# Patient Record
Sex: Female | Born: 1950 | Race: White | Hispanic: No | Smoking: Former smoker
Health system: Southern US, Community
[De-identification: ages and names within clinical notes are randomized; demographics above are authoritative.]

## PROBLEM LIST (undated history)

## (undated) DIAGNOSIS — I10 Essential (primary) hypertension: Secondary | ICD-10-CM

## (undated) DIAGNOSIS — Z8719 Personal history of other diseases of the digestive system: Secondary | ICD-10-CM

## (undated) DIAGNOSIS — Z973 Presence of spectacles and contact lenses: Secondary | ICD-10-CM

## (undated) DIAGNOSIS — H8110 Benign paroxysmal vertigo, unspecified ear: Secondary | ICD-10-CM

## (undated) DIAGNOSIS — Z87898 Personal history of other specified conditions: Secondary | ICD-10-CM

## (undated) DIAGNOSIS — E785 Hyperlipidemia, unspecified: Secondary | ICD-10-CM

## (undated) DIAGNOSIS — M1990 Unspecified osteoarthritis, unspecified site: Secondary | ICD-10-CM

## (undated) HISTORY — PX: TOTAL HIP ARTHROPLASTY: SHX124

## (undated) HISTORY — PX: BREAST SURGERY: SHX581

---

## 1997-10-23 ENCOUNTER — Other Ambulatory Visit: Admission: RE | Admit: 1997-10-23 | Discharge: 1997-10-23 | Payer: Self-pay | Admitting: Obstetrics and Gynecology

## 1998-12-08 ENCOUNTER — Other Ambulatory Visit: Admission: RE | Admit: 1998-12-08 | Discharge: 1998-12-08 | Payer: Self-pay | Admitting: Obstetrics and Gynecology

## 2000-01-31 ENCOUNTER — Other Ambulatory Visit: Admission: RE | Admit: 2000-01-31 | Discharge: 2000-01-31 | Payer: Self-pay | Admitting: Gynecology

## 2000-09-06 ENCOUNTER — Encounter: Payer: Self-pay | Admitting: Orthopedic Surgery

## 2000-09-11 ENCOUNTER — Inpatient Hospital Stay (HOSPITAL_COMMUNITY): Admission: RE | Admit: 2000-09-11 | Discharge: 2000-09-15 | Payer: Self-pay | Admitting: Orthopedic Surgery

## 2000-09-11 ENCOUNTER — Encounter: Payer: Self-pay | Admitting: Orthopedic Surgery

## 2001-03-27 ENCOUNTER — Other Ambulatory Visit: Admission: RE | Admit: 2001-03-27 | Discharge: 2001-03-27 | Payer: Self-pay | Admitting: Gynecology

## 2009-03-02 ENCOUNTER — Encounter: Admission: RE | Admit: 2009-03-02 | Discharge: 2009-03-02 | Payer: Self-pay | Admitting: Orthopedic Surgery

## 2010-07-16 NOTE — Discharge Summary (Signed)
[place]. [facility]  Patient:    Mindy Snyder, Mindy Snyder                   MRN: [id number] Adm. Date:  [id number] Disc. Date: [id number] Attending:  Carolan Shiver Ii Dictator:   Arnoldo Morale, P.A.C. CC:         Chales Salmon. Abigail Miyamoto, M.D., [facility]   Discharge Summary  ADMISSION DIAGNOSIS:  End-stage osteoarthritis right hip.  DISCHARGE DIAGNOSES: 1. End-stage osteoarthritis right hip. 2. Post hemorrhagic anemia. 3. Hypokalemia. 4. Postoperative constipation. 5. Hematoma right thigh.  SURGICAL PROCEDURE:  On September 11, 2000, Mindy Snyder underwent a right total knee arthroplasty by Jonny Ruiz L. Dorothyann Gibbs, M.D., assisted by Arnoldo Morale, P.A.C.  COMPLICATIONS:  None.  CONSULTS: 1. Physical therapy consult on September 12, 2000. 2. Case management and occupational therapy consult September 13, 2000. 3. Rehab medicine consult September 12, 2000.  HISTORY OF PRESENT ILLNESS:  A 60 year old white female patient presented to Dr. Priscille Kluver with a two to three-year history of gradual onset of progressively worsening right hip pain. There has been no known injury to her hip but the pain has been getting progressively worse since March of 2002.  The pain is a burning and throbbing sensation in the right buttock and groin area that is intermittent and occasionally radiates to the lateral aspect of her right leg. It increases with any walking or standing and decreases with sitting and lying down.  It keeps her awake at night and she has required the use of a cane p.r.n.  She has failed conservative therapy and because of that, she is presenting for a right total hip arthroplasty.  HOSPITAL COURSE:  The patient tolerated her surgical procedure well without immediate postoperative complications. She was transferred to 5000.  Postop day #1, T-max was 98.9, vitals were stable, pain controlled.  Hemoglobin was 9, hematocrit 25.9.  Right hip dressing intact with minimal  drainage.  Leg neurovascularly intact. She was started on iron and hemoglobin was monitored.  On postop day #2, no complaints of lightheadedness when out of bed.  T-max was 100.6, pulse 97, blood pressure 100/60.  Right hip incision well approximated with staples without drainage. Hemoglobin 86, hematocrit 24.8.  Potassium was low at 3.3.  She was continued on iron, given potassium supplements and was watched for symptoms of the anemia. She did become symptomatic that day and was subsequently transfused with two units of packed red blood cells.  She was switched to p.o. pain medications and an Ace wrap was applied around her thigh.  On postop day #3, she was feeling much better.  Pain remained controlled.  The T-max was 99.3, vitals stable.  Right leg unchanged.  Edema of the right thigh did appear to be improving. Hemoglobin was 10, hematocrit 28.4.  Hypokalemia had resolved with potassium now of 3.8.  Laxatives were given for her complaints of constipation and she was continued on therapy.  On September 15, 2000, she was doing well enough for discharge home.  T-max was 100.4.  Right hip incision unchanged.  Hemoglobin at that time to follow up after the transfusion was 10.6 and 31. She was believed ready for discharge home.  DISCHARGE INSTRUCTIONS: 1. She is to resume all prehospitalization medications and diet except    for her ibuprofen and Vicodin. These medications include:    a. Premarin 1.25 mg p.o. q.d.    b. Progesterone 10 mg p.o. q.d.  c. Glucosamine and chondroitin sulfate one tablet p.o. q.d.  ADDITIONAL MEDICATIONS: 1. Arixtra 2.5 mg subcu q.d. at 5:30 p.m. for three more days. 2. Ferrous sulfate 325 mg p.o. b.i.d. 3. OxyContin 20 mg one p.o. q.12h. 22 with no refill. 4. OxyIR 5 mg one to two p.o. q.4h. p.r.n. for pain, 45 with no refill. 5. Keflex 500 mg p.o. q.i.d. for seven days, 28 with no refill.  ACTIVITY:  She is to be weightbearing as tolerated on the right  leg with use of the walker.  She is arranged for home health physical therapy per [facility]. She is to keep a K-pad to her right hip when she is resting.  WOUND CARE:  Keep the right hip incision clean and dry and change the dressing p.r.n. saturation.  Please notify Dr. Priscille Kluver if temperature greater than 101.5, chills, pain unrelieved by pain medications or foul smelling drainage from the wound.  FOLLOW-UP:  She needs to follow up with Dr. Priscille Kluver in our office on September 26, 2000, and she needs to call [phone] to set up that appointment.  She stated good understanding of these instructions and was discharged home.  LABORATORY DATA:  X-ray taken of her right hip on September 11, 2000, showed femoral head well located in the prosthetic acetabulum and no evidence of fracture.  On September 12, 2000, hemoglobin 9, hematocrit 25.9.  On September 13, 2000, hemoglobin 8.6, hematocrit 24.8.  On September 14, 2000, hemoglobin 10, hematocrit 28.4.  On September 15, 2000, hemoglobin 10.6, hematocrit 31.  On September 06, 2000, albumin 3.4.  On September 12, 2000, glucose 129, BUN 5, creatinine 0.6, calcium 8.3.  On September 13, 2000, sodium 138, potassium 3.3, glucose 118, BUN 3, creatinine 0.5 and calcium 8.2.  On September 14, 2000, sodium 141, potassium 3.8, chloride 105, CO2 30, glucose 107, BUN less than 3, creatinine 0.6, and calcium 8.7.  All other laboratory studies were within normal limits.    DD:  09/28/00 TD:  09/28/00 Job: [id number]

## 2010-07-16 NOTE — Op Note (Signed)
[place]. [facility]  Patient:    Mindy Snyder, HAND                   MRN: [id number] Proc. Date: 09/11/00 Adm. Date:  [id number] Attending:  Carolan Shiver Ii                           Operative Report  PREOPERATIVE DIAGNOSIS:  Osteoarthritis of the right hip.  SURGICAL PROCEDURE:  Right AML total hip replacement.  POSTOPERATIVE DIAGNOSIS:  Osteoarthritis of the right hip.  SURGEON:  John L. Dorothyann Gibbs, M.D.  ASSISTANT:  Arnoldo Morale, P.A.  ANESTHESIA:  General.  PATHOLOGY:  The patient is only 84, but she has worn out her hip with bone against bone and normal osteoarthritic appearance to bone femoral head and acetabulum.  DESCRIPTION OF PROCEDURE:  Under general anesthesia, the patient was placed in the left lateral decubitus position.  The hip was prepared with DuraPrep and draped as a sterile field.  A posterior approach was made, splitting the IT band in the line of its fibers and inserting a Charnley retractor.  The short external rotators and hip capsule were taken down from bone with electrocautery.  Multiple small vessels were cauterized.  The hip capsule was exposed and opened in a T-shaped manner.  The hip was then dislocated after hemostasis was complete.  The superior femoral neck was then exposed.  The IM initiator and canal finder were used, followed by progressive reaming to a #11.5 reamer, which gave an excellent tight fit.  The femoral neck was osteotomized and the first rasp a #10/5 narrow is used, followed by a #12 narrow.  Calcar reamers were used on both and, in seating the rasps, they fit fairly low, giving a fairly short neck length.  The calcar reamer was used to give an excellent tight fit inferiorly.  Following this, the acetabulum was exposed with two Cobras inferiorly and two wings superiorly.  The capsule was preserved, but the labrum was excised.  It was noted to be elliptical in shape with deeper and more round  superiorly.  It required reaming more than the initial templates, progressively reaming it up to a 52 mm.  Subsequently, a 53 mm gave an excellent rim fit.  It was sized for a 54 on rim fit trial.  A 54 300 series cup was then press fit in place.  It was an excellent tight fit. The external guide was used to assist in positioning of the acetabulum. Following this, trial reduction of a standard +10 poly and the 12 mm modified medial rasp and a +5 hip ball was used.  There was noted to be slight instability in flexion and internal rotation.  We tried several modifications including the anteverted neck and the +4 poly to try to make the hip more stable.  THe +4 poly made a huge difference.  It was then stable with a standard AML neck, +5 hip ball and the +4 poly.  Permanent components were then obtained.  The apex hole eliminator was used.  The wound was copiously irrigated with antibiotic solution.  The +4 acetabulum with the 10 degree lip was then inserted in place. The modified medial 12 mm AML stem, 5/8 Porocoat was inserted.  The 28 mm +5 neck length hip was used.  With permanent components in place, the hip was stable through normal range of motion.  The hip capsule was then  repaired with #1 Surgidek.  The piriformis and short external rotators were reattached.  The IT band was then closed.  The subcu was deeper than anticipated and closed in two layers with 0 and 2-0 Vicryl and the skin with clips.  OPERATIVE TIME:  1 hr 10 min.  ESTIMATED BLOOD LOSS:  200 cc.  DISPOSITION:  The patient tolerated the procedure well and returned to recovery in good condition. DD:  09/11/00 TD:  09/11/00 Job: [id number]

## 2010-07-16 NOTE — H&P (Signed)
[place]. [facility]  Patient:    Mindy Snyder, Mindy Snyder                   MRN: [id number] Adm. Date:  09/11/00 Attending:  Jonny Ruiz L. Dorothyann Gibbs, M.D. Dictator:   Arnoldo Morale, P.A.                         History and Physical  DATE OF BIRTH: [birth year]  CHIEF COMPLAINT: Progressively worsening right hip pain for the last two to three years.  HISTORY OF PRESENT ILLNESS: This 60 year old white female patient presented to Dr. Priscille Kluver with a two to three year history of gradual onset, but progressively worsening right hip pain.  She has no known injury or prior surgery to her hip.  The pain has been getting progressively worse especially since March 2002.  At this point the pain is described as a burning and throbbing sensation located in the right buttock and groin area that is intermittent.  The pain does occasionally radiate down the lateral aspect of her leg, but otherwise it increases with any walking or standing, and decreases with sitting and lying down.  The pain does keep her up at night. She does have to use a cane on occasion.  She has a significant limp when she walks.  She is currently taking ibuprofen and Vicodin p.r.n. for the pain, and that provides a moderate amount of relief.  She denies any popping, catching, grinding, locking, swelling, or paresthesias associated with the pain.  ALLERGIES: CODEINE causes GI upset.  CURRENT MEDICATIONS:  1. Premarin 1.25 mg one tablet p.o. q.d.  2. Progesterone 10 mg one tablet p.o. q.d.  (She has stopped taking that as     of September 04, 2000).  3. Ibuprofen 800 mg one tablet p.o. b.i.d. p.r.n. pain.  4. Vicodin one to two tablets p.o. q.4h p.r.n. for pain.  5. Glucosamine and chondroitin sulfate triple-strength tablets, one     tablet p.o. q.d.  PAST MEDICAL HISTORY: She denies any history of diabetes mellitus, hypertension thyroid disease, hiatal hernia, peptic ulcer disease, heart disease, reflux, or  any chronic medical condition other than noted previously.  PAST SURGICAL HISTORY:  1. Tonsillectomy at age 51.  2. Excision of benign breast lump, left breast, approximately 1970.  3. D&C in 1997.  SOCIAL HISTORY: She has a 20-30 pack-year history of cigarette smoking, which she quit in 1990.  She drinks one alcoholic beverage a night, and has done so probably for the last 30 years.  She does not use any drugs.  She is married and has two children.  She and her husband live in a one-story house, with four steps into the main entrance.  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  Her medical doctor is Dr. Abigail Miyamoto at [facility], and his phone number is [phone].  FAMILY HISTORY: Her mother is alive at age 28, with no major medical problems. Her father is alive at age 50, with diabetes, high cholesterol, and hypertension.  She has two brothers, age 36 and 77, and the 39 year old has diverticulosis.  Her daughters are age 82 and 21, and they are alive and healthy.  REVIEW OF SYSTEMS: Her last normal period was August 09, 2000.  She was advised to stop taking the progesterone to avoid a period while she is hospitalized. She is gravida 2 para 2, 0-0-2.  She does wear  glasses.  She does not have a Living Will nor a power of attorney.  All other systems are negative and noncontributory at this time.  PHYSICAL EXAMINATION:  GENERAL: This is a well-developed, well-nourished white female, who walks with a significant hip limp on the right.  There is an antalgic gait.  Mood and affect are appropriate.  She talks easily with the examiner.  VITAL SIGNS: Height 5 feet 5 inches.  Weight 135 pounds.  BMI 22.  TEMP 97.6 degrees Fahrenheit, pulse 88, respirations 18, and BP 116/60.  HEENT: Normocephalic, atraumatic, without frontal or maxillary sinus tenderness to palpation.  Conjunctivae pink.  Sclerae anicteric.  PERRLA. EOMI.  Funduscopic examination  shows visible red reflex bilaterally, with normal retinal vasculature, optic disc, and cup.  No visible external ear deformities.  Hearing grossly intact.  Tympanic membranes pearly gray bilaterally with good light reflex.  Nose and nasal septum midline.  Nasal mucosa pink and moist, without exudate or polyps noted.  Buccal mucosa pink and moist.  Good dentition.  Pharynx without erythema or exudate.  Tongue and uvula are midline.  Tongue without fasciculations, and uvula rises equally with phonation.  NECK: No visible masses or lesions noted.  Trachea midline.  No palpable lymphadenopathy.  No thyromegaly.  Carotids +2 bilaterally without bruits. Full range of motion.  Nontender to palpation along the cervical spine.  CARDIOVASCULAR: Heart rate and rhythm regular.  S1 and S2 present without rubs, clicks, or murmurs noted.  RESPIRATORY: Respirations even and unlabored.  Breath sounds clear to auscultation bilaterally without rales or wheezes noted.  ABDOMEN: Rounded abdominal contour.  Bowel sounds present x 4 quadrants. Soft, nontender to palpation, without hepatosplenomegaly or CVA tenderness. Femoral pulses +2 bilaterally.  No tenderness to palpation along the entire length of the vertebral column.  BREAST/GU/RECTAL/PELVIC: These examinations deferred at this time.  MUSCULOSKELETAL: No obvious deformities of bilateral upper extremities, with full range of motion of these extremities without pain.  Radial pulses are +2 bilaterally.  She has full range of motion of her knees, ankles, and toes bilaterally.  No effusion noted in her knees.  DP and PT pulses are +2.  She has full range of motion of her left hip without pain.  She has full extension, with flexion to about 130 degrees.  Internal and external rotation is easy and painless.  There is no rocking of the pelvis with range of motion. No pain with palpation in the left groin.  She has minimal pain with palpation in the right  groin.  She has full extension, but flexion only to about 90 degrees and this does cause pain.  She has no internal and external rotation  when she is flexed at about 45-60 degrees.  When she is in full extension she has minimal internal and external rotation and her pelvis does rock with this.  NEUROLOGIC: Alert and oriented x 3.  Cranial nerves 2-12 grossly intact. Strength 5/5 in bilateral upper and lower extremities.  Sensation intact to light touch.  Deep tendon reflexes 2+ in bilateral upper and lower extremities.  Rapid alternating movements intact.  RADIOLOGIC FINDINGS: X-rays taken in June 2002 showed progression of the arthritis in her right hip since January 2002.  At this point there was no joint space remaining, with bone-against-bone contact in the right hip.  IMPRESSION: End-stage osteoarthritis, right hip.  PLAN: Ms. Mindy Snyder will be admitted to [facility] on September 11, 2000, where she will undergo a right total  hip arthroplasty by Dr. Jonny Ruiz L. Rendall.  She will undergo all the routine preoperative laboratory tests and studies prior to this procedure. DD:  09/06/00 TD:  09/06/00 Job: [id number]

## 2011-02-04 ENCOUNTER — Other Ambulatory Visit: Payer: Self-pay | Admitting: Family Medicine

## 2011-02-04 DIAGNOSIS — Z780 Asymptomatic menopausal state: Secondary | ICD-10-CM

## 2011-02-04 DIAGNOSIS — Z1231 Encounter for screening mammogram for malignant neoplasm of breast: Secondary | ICD-10-CM

## 2011-03-10 ENCOUNTER — Encounter (HOSPITAL_COMMUNITY): Payer: Self-pay

## 2011-03-10 ENCOUNTER — Other Ambulatory Visit: Payer: Self-pay | Admitting: Orthopedic Surgery

## 2011-03-10 NOTE — H&P (Signed)
Mindy Snyder  DOB: 10/26/1950 Widowed / Race: White / Female  Date of Admission:  03/23/2011  Chief Complaint: Left Hip Pain  History of Present Illness The patient is a 61 year old female who comes in today for a preoperative History and Physical. The patient is scheduled for a left total hip arthroplasty to be performed by Dr. Frank V. Aluisio, MD at [facility] on 03/23/2011. They have been treated conservatively in the past for the above stated problem and despite conservative measures, they continue to have progressive pain and severe functional limitations and dysfunction. They have failed non-operative management including home exercise, medications, and injections. It is felt that they would benefit from undergoing total joint replacement. Risks and benefits of the procedure have been discussed with the patient and they elect to proceed with surgery. There are no active contraindications to surgery such as ongoing infection or rapidly progressive neurological disease. The hip is having a much greater effect on her lifestyle. She hurts throughout the day when she is active. Occasionally she will hurt at night effecting her ability to do activities she desires and also ability to even do activities of daily living. She is ready to get the hip fixed.  Allergies NKDA  Intolerance Codeine Derivatives - Nausea.  Medication History Ibuprofen (800MG Tablet, Oral as needed) Active.  Problem List/Past Medical Osteopenia Transfusion History - Past transfusion with previous right total hip surgery.  Past Surgical History Total Hip Replacement -Right  Family History Chronic Obstructive Lung Disease. mother Diabetes Mellitus. father and grandfather fathers side Hypertension. mother and father Osteoarthritis. mother, father and grandmother fathers side Osteoporosis. mother  Social History Alcohol use. current drinker; drinks wine; 5-7 per week Children.  2 Current work status. working full time Drug/Alcohol Rehab (Currently). no Drug/Alcohol Rehab (Previously). no Exercise. Exercises rarely Illicit drug use. no Living situation. live alone Marital status. widowed Number of flights of stairs before winded. 2-3 Pain Contract. no Tobacco / smoke exposure. no Tobacco use. Former smoker. pack a day Former smoker; smoke(d) 1 pack(s) per day Pregnant. no  Review of Systems General:Not Present- Chills, Fever, Night Sweats, Appetite Loss, Fatigue, Feeling sick, Weight Gain and Weight Loss. Skin:Not Present- Itching, Rash, Skin Color Changes, Ulcer, Psoriasis and Change in Hair or Nails. HEENT:Not Present- Sensitivity to light, Hearing problems, Nose Bleed and Ringing in the Ears. Neck:Not Present- Swollen Glands and Neck Mass. Respiratory:Not Present- Snoring, Chronic Cough, Bloody sputum and Dyspnea. Cardiovascular:Not Present- Shortness of Breath, Chest Pain, Swelling of Extremities, Leg Cramps and Palpitations. Gastrointestinal:Not Present- Bloody Stool, Heartburn, Abdominal Pain, Vomiting, Nausea and Incontinence of Stool. Female Genitourinary:Not Present- Blood in Urine, Menstrual Irregularities, Frequency, Incontinence and Nocturia. Musculoskeletal:Present- Joint Pain. Not Present- Muscle Weakness, Muscle Pain, Joint Stiffness, Joint Swelling and Back Pain. Psychiatric:Not Present- Anxiety, Depression and Memory Loss. Endocrine:Not Present- Cold Intolerance, Heat Intolerance, Excessive hunger and Excessive Thirst. Hematology:Not Present- Abnormal Bleeding, Anemia, Blood Clots and Easy Bruising.  Vitals Weight: 150 lb Height: 65 in Body Surface Area: 1.77 m Body Mass Index: 24.96 kg/m Pulse: 68 (Regular) Resp.: 14 (Unlabored) BP: 142/68 (Sitting, Left Arm, Standard)  Physical Exam The physical exam findings are as follows: Patient is a 61 year old female with continued hip pain. Patient is  accompanied today by her mother.  General Mental Status - Alert, cooperative and good historian. General Appearance- pleasant. Not in acute distress. Orientation- Oriented X3. Build & Nutrition- Well nourished and Well developed.  Head and Neck Head- normocephalic, atraumatic .   Neck Global Assessment- supple. no bruit auscultated on the right and no bruit auscultated on the left.  Eye Pupil- Bilateral- Regular and Round. Motion- Bilateral- EOMI.  Chest and Lung Exam Auscultation: Breath sounds:- clear at anterior chest wall and - clear at posterior chest wall. Adventitious sounds:- No Adventitious sounds.  Cardiovascular Auscultation:Rhythm- Regular rate and rhythm. Heart Sounds- S1 WNL and S2 WNL. Murmurs & Other Heart Sounds:Auscultation of the heart reveals - No Murmurs.  Abdomen Palpation/Percussion:Tenderness- Abdomen is non-tender to palpation. Rigidity (guarding)- Abdomen is soft. Auscultation:Auscultation of the abdomen reveals - Bowel sounds normal.  Female Genitourinary Not done, not pertinent to present illness  Musculoskeletal Her right hip has normal range of motion with no discomfort. Her left hip can be flexed to 90. No internal rotation. Minimal external rotation to about 20-30 degrees of abduction. She has pain within the rotational and abduction moves. Her knee exam is normal on the left. Pulse, sensation, and motor are intact. Gait pattern is antalgic on the left.  RADIOGRAPHS: AP pelvis, AP and lateral of the hip shows severe endstage arthritis of the left hip, bone on bone with flattening of the femoral head and subchondral cystic formation. There is slight advancement compared to x-rays from 6 months ago.  Assessment & Plan Osteoarthritis Left Hip  Patient is for a Left Total Hip Arthroplasty by Dr. Aluisio.  Plan is to go home after the surgery.  Drew Kijana Estock, PA-C  

## 2011-03-16 ENCOUNTER — Encounter (HOSPITAL_COMMUNITY)
Admission: RE | Admit: 2011-03-16 | Discharge: 2011-03-16 | Disposition: A | Discharge status: Home / Self Care | Payer: Federal, State, Local not specified - PPO | Source: Ambulatory Visit | Attending: Orthopedic Surgery | Admitting: Orthopedic Surgery

## 2011-03-16 ENCOUNTER — Encounter (HOSPITAL_COMMUNITY): Payer: Self-pay

## 2011-03-16 ENCOUNTER — Ambulatory Visit (HOSPITAL_COMMUNITY)
Admission: RE | Admit: 2011-03-16 | Discharge: 2011-03-16 | Disposition: A | Discharge status: Home / Self Care | Payer: Federal, State, Local not specified - PPO | Source: Ambulatory Visit | Attending: Orthopedic Surgery | Admitting: Orthopedic Surgery

## 2011-03-16 DIAGNOSIS — M169 Osteoarthritis of hip, unspecified: Secondary | ICD-10-CM | POA: Insufficient documentation

## 2011-03-16 DIAGNOSIS — Z01818 Encounter for other preprocedural examination: Secondary | ICD-10-CM | POA: Insufficient documentation

## 2011-03-16 DIAGNOSIS — Z01812 Encounter for preprocedural laboratory examination: Secondary | ICD-10-CM | POA: Insufficient documentation

## 2011-03-16 DIAGNOSIS — M1610 Unilateral primary osteoarthritis, unspecified hip: Secondary | ICD-10-CM | POA: Insufficient documentation

## 2011-03-16 HISTORY — DX: Unspecified osteoarthritis, unspecified site: M19.90

## 2011-03-16 LAB — COMPREHENSIVE METABOLIC PANEL
ALT: 14 U/L (ref 0–35)
AST: 14 U/L (ref 0–37)
Alkaline Phosphatase: 82 U/L (ref 39–117)
CO2: 26 mEq/L (ref 19–32)
Chloride: 105 mEq/L (ref 96–112)
Creatinine, Ser: 0.69 mg/dL (ref 0.50–1.10)
GFR calc non Af Amer: >90 mL/min (ref >90)
Potassium: 4.8 mEq/L (ref 3.5–5.1)
Total Bilirubin: 0.3 mg/dL (ref 0.3–1.2)

## 2011-03-16 LAB — URINALYSIS, ROUTINE W REFLEX MICROSCOPIC
Bilirubin Urine: NEGATIVE
Glucose, UA: NEGATIVE mg/dL
Hgb urine dipstick: NEGATIVE
Ketones, ur: NEGATIVE mg/dL
Specific Gravity, Urine: 1.014 (ref 1.005–1.030)
pH: 7.5 (ref 5.0–8.0)

## 2011-03-16 LAB — PROTIME-INR: INR: 0.92 (ref 0.00–1.49)

## 2011-03-16 LAB — APTT: aPTT: 30 seconds (ref 24–37)

## 2011-03-16 LAB — CBC
MCV: 94.6 fL (ref 78.0–100.0)
Platelets: 244 10*3/uL (ref 150–400)
RBC: 4.41 MIL/uL (ref 3.87–5.11)
WBC: 5 10*3/uL (ref 4.0–10.5)

## 2011-03-16 LAB — SURGICAL PCR SCREEN: MRSA, PCR: NEGATIVE

## 2011-03-16 NOTE — Pre-Procedure Instructions (Signed)
PT HAS EKG REPORT FROM HER MEDICAL DOCTOR - DR. NNODI.  IT WAS DONE 02/04/11--REPORT IS ON [name] CHART.  CXR NOT NEEDED-PER ANESTHESIOLOGIST'S GUIDELINES.

## 2011-03-16 NOTE — Patient Instructions (Signed)
[address] STEVE[address] STAPLETON Sciver  03/16/2011   Your procedure is scheduled on:  [facility]  1/23  AT 3:55 PM  Report to [facility] at  1:30  PM  Call this number if you have problems the morning of surgery: [phone]   Remember:   Do not eat food AFTER MIDNIGHT THE NIGHT BEFORE YOUR SURGERY.  May have clear liquids TO DRINK FROM MIDNIGHT UNTIL 9:30 AM DAY OF SURGERY--BUT NOTHING TO DRINK AFTER 9:30 AM DAY OF SURGERY!!!  Clear liquids include soda, tea, black coffee, apple or grape juice, WATER.  Take these medicines the morning of surgery with A SIP OF WATER: NO MEDS   Do not wear jewelry, make-up or nail polish.  Do not wear lotions, powders, or perfumes.   Do not shave 48 hours prior to surgery.  Do not bring valuables to the hospital.  Contacts, dentures or bridgework may not be worn into surgery.  Leave suitcase in the car. After surgery it may be brought to your room.  For patients admitted to the hospital, checkout time is 11:00 AM the day of discharge.   Patients discharged the day of surgery will not be allowed to drive home.    Special Instructions: CHG Shower Use Special Wash: 1/2 bottle night before surgery and 1/2 bottle morning of surgery.   Please read over the following fact sheets that you were given: Blood Transfusion Information and MRSA Information AND INCENTIVE SPIROMETER INSTRUCTIONS

## 2011-03-17 ENCOUNTER — Other Ambulatory Visit: Payer: Self-pay

## 2011-03-17 ENCOUNTER — Ambulatory Visit: Payer: Self-pay

## 2011-03-22 MED ORDER — BUPIVACAINE 0.25 % ON-Q PUMP SINGLE CATH 300ML
300.0000 mL | INJECTION | Status: DC
  Filled 2011-03-22: qty 300

## 2011-03-23 ENCOUNTER — Inpatient Hospital Stay (HOSPITAL_COMMUNITY)
Admission: RE | Admit: 2011-03-23 | Discharge: 2011-03-26 | DRG: 818 | Disposition: A | Discharge status: Home Health | Payer: Federal, State, Local not specified - PPO | Source: Ambulatory Visit | Attending: Orthopedic Surgery | Admitting: Orthopedic Surgery

## 2011-03-23 ENCOUNTER — Inpatient Hospital Stay (HOSPITAL_COMMUNITY): Payer: Federal, State, Local not specified - PPO | Admitting: Anesthesiology

## 2011-03-23 ENCOUNTER — Encounter (HOSPITAL_COMMUNITY)
Admission: RE | Disposition: A | Discharge status: Home Health | Payer: Self-pay | Source: Ambulatory Visit | Attending: Orthopedic Surgery

## 2011-03-23 ENCOUNTER — Inpatient Hospital Stay (HOSPITAL_COMMUNITY): Payer: Federal, State, Local not specified - PPO

## 2011-03-23 ENCOUNTER — Other Ambulatory Visit (HOSPITAL_COMMUNITY): Payer: Self-pay

## 2011-03-23 ENCOUNTER — Encounter (HOSPITAL_COMMUNITY): Payer: Self-pay | Admitting: *Deleted

## 2011-03-23 ENCOUNTER — Encounter (HOSPITAL_COMMUNITY): Payer: Self-pay | Admitting: Anesthesiology

## 2011-03-23 DIAGNOSIS — Z96649 Presence of unspecified artificial hip joint: Secondary | ICD-10-CM

## 2011-03-23 DIAGNOSIS — M169 Osteoarthritis of hip, unspecified: Secondary | ICD-10-CM | POA: Diagnosis present

## 2011-03-23 DIAGNOSIS — Z9289 Personal history of other medical treatment: Secondary | ICD-10-CM

## 2011-03-23 DIAGNOSIS — E871 Hypo-osmolality and hyponatremia: Secondary | ICD-10-CM | POA: Diagnosis not present

## 2011-03-23 DIAGNOSIS — M1610 Unilateral primary osteoarthritis, unspecified hip: Principal | ICD-10-CM | POA: Diagnosis present

## 2011-03-23 DIAGNOSIS — D62 Acute posthemorrhagic anemia: Secondary | ICD-10-CM | POA: Diagnosis not present

## 2011-03-23 HISTORY — PX: TOTAL HIP ARTHROPLASTY: SHX124

## 2011-03-23 SURGERY — ARTHROPLASTY, HIP, TOTAL,POSTERIOR APPROACH
Anesthesia: General | Site: Hip | Laterality: Left | Wound class: Clean

## 2011-03-23 MED ORDER — FENTANYL CITRATE 0.05 MG/ML IJ SOLN
25.0000 ug | INTRAMUSCULAR | Status: DC | PRN
  Administered 2011-03-23: 50 ug via INTRAVENOUS
  Administered 2011-03-23 (×4): 25 ug via INTRAVENOUS

## 2011-03-23 MED ORDER — METHOCARBAMOL 100 MG/ML IJ SOLN
500.0000 mg | Freq: Four times a day (QID) | INTRAVENOUS | Status: DC | PRN
  Administered 2011-03-23 – 2011-03-24 (×2): 500 mg via INTRAVENOUS
  Filled 2011-03-23 (×3): qty 5

## 2011-03-23 MED ORDER — DOCUSATE SODIUM 100 MG PO CAPS
100.0000 mg | ORAL_CAPSULE | Freq: Two times a day (BID) | ORAL | Status: DC
  Administered 2011-03-23 – 2011-03-26 (×6): 100 mg via ORAL
  Filled 2011-03-23 (×7): qty 1

## 2011-03-23 MED ORDER — LACTATED RINGERS IV SOLN
INTRAVENOUS | Status: DC
  Administered 2011-03-23: 17:00:00 via INTRAVENOUS
  Administered 2011-03-23: 1000 mL via INTRAVENOUS

## 2011-03-23 MED ORDER — PROPOFOL 10 MG/ML IV EMUL
INTRAVENOUS | Status: DC | PRN
  Administered 2011-03-23: 150 mg via INTRAVENOUS

## 2011-03-23 MED ORDER — BUPIVACAINE LIPOSOME 1.3 % IJ SUSP
20.0000 mL | INTRAMUSCULAR | Status: AC
  Administered 2011-03-23: 20 mL
  Filled 2011-03-23: qty 20

## 2011-03-23 MED ORDER — ACETAMINOPHEN 325 MG PO TABS: 650.0000 mg | ORAL_TABLET | Freq: Four times a day (QID) | ORAL | Status: DC | PRN

## 2011-03-23 MED ORDER — ONDANSETRON HCL 4 MG/2ML IJ SOLN
INTRAMUSCULAR | Status: DC | PRN
  Administered 2011-03-23: 4 mg via INTRAVENOUS

## 2011-03-23 MED ORDER — ONDANSETRON HCL 4 MG PO TABS: 4.0000 mg | ORAL_TABLET | Freq: Four times a day (QID) | ORAL | Status: DC | PRN

## 2011-03-23 MED ORDER — ACETAMINOPHEN 650 MG RE SUPP: 650.0000 mg | Freq: Four times a day (QID) | RECTAL | Status: DC | PRN

## 2011-03-23 MED ORDER — METHOCARBAMOL 500 MG PO TABS
500.0000 mg | ORAL_TABLET | Freq: Four times a day (QID) | ORAL | Status: DC | PRN
  Administered 2011-03-24 – 2011-03-26 (×4): 500 mg via ORAL
  Filled 2011-03-23 (×4): qty 1

## 2011-03-23 MED ORDER — MIDAZOLAM HCL 5 MG/5ML IJ SOLN
INTRAMUSCULAR | Status: DC | PRN
  Administered 2011-03-23: 2 mg via INTRAVENOUS

## 2011-03-23 MED ORDER — OXYCODONE HCL 5 MG PO TABS
5.0000 mg | ORAL_TABLET | ORAL | Status: DC | PRN
  Administered 2011-03-23: 5 mg via ORAL
  Administered 2011-03-24 – 2011-03-26 (×11): 10 mg via ORAL
  Filled 2011-03-23 (×5): qty 2
  Filled 2011-03-23: qty 1
  Filled 2011-03-23 (×5): qty 2
  Filled 2011-03-23: qty 1
  Filled 2011-03-23: qty 2

## 2011-03-23 MED ORDER — METOCLOPRAMIDE HCL 5 MG/ML IJ SOLN: 5.0000 mg | Freq: Three times a day (TID) | INTRAMUSCULAR | Status: DC | PRN

## 2011-03-23 MED ORDER — ONDANSETRON HCL 4 MG/2ML IJ SOLN: 4.0000 mg | Freq: Four times a day (QID) | INTRAMUSCULAR | Status: DC | PRN

## 2011-03-23 MED ORDER — RIVAROXABAN 10 MG PO TABS
10.0000 mg | ORAL_TABLET | Freq: Every day | ORAL | Status: DC
  Administered 2011-03-24 – 2011-03-26 (×3): 10 mg via ORAL
  Filled 2011-03-23 (×3): qty 1

## 2011-03-23 MED ORDER — FENTANYL CITRATE 0.05 MG/ML IJ SOLN
INTRAMUSCULAR | Status: AC
  Filled 2011-03-23: qty 2

## 2011-03-23 MED ORDER — ACETAMINOPHEN 10 MG/ML IV SOLN
1000.0000 mg | Freq: Four times a day (QID) | INTRAVENOUS | Status: AC
  Administered 2011-03-23 – 2011-03-24 (×4): 1000 mg via INTRAVENOUS
  Filled 2011-03-23 (×6): qty 100

## 2011-03-23 MED ORDER — DROPERIDOL 2.5 MG/ML IJ SOLN
INTRAMUSCULAR | Status: DC | PRN
  Administered 2011-03-23: 0.625 mg via INTRAVENOUS

## 2011-03-23 MED ORDER — PHENOL 1.4 % MT LIQD: 1.0000 | OROMUCOSAL | Status: DC | PRN

## 2011-03-23 MED ORDER — ACETAMINOPHEN 10 MG/ML IV SOLN
INTRAVENOUS | Status: DC | PRN
  Administered 2011-03-23: 1000 mg via INTRAVENOUS

## 2011-03-23 MED ORDER — DEXAMETHASONE SODIUM PHOSPHATE 10 MG/ML IJ SOLN
INTRAMUSCULAR | Status: DC | PRN
  Administered 2011-03-23: 10 mg via INTRAVENOUS

## 2011-03-23 MED ORDER — 0.9 % SODIUM CHLORIDE (POUR BTL) OPTIME
TOPICAL | Status: DC | PRN
  Administered 2011-03-23: 1000 mL

## 2011-03-23 MED ORDER — MORPHINE SULFATE 2 MG/ML IJ SOLN
1.0000 mg | INTRAMUSCULAR | Status: DC | PRN
  Administered 2011-03-23 (×2): 1 mg via INTRAVENOUS
  Administered 2011-03-24 – 2011-03-25 (×2): 2 mg via INTRAVENOUS
  Filled 2011-03-23 (×3): qty 1

## 2011-03-23 MED ORDER — ROCURONIUM BROMIDE 100 MG/10ML IV SOLN
INTRAVENOUS | Status: DC | PRN
  Administered 2011-03-23: 40 mg via INTRAVENOUS

## 2011-03-23 MED ORDER — MENTHOL 3 MG MT LOZG
1.0000 | LOZENGE | OROMUCOSAL | Status: DC | PRN
  Filled 2011-03-23: qty 9

## 2011-03-23 MED ORDER — FLEET ENEMA 7-19 GM/118ML RE ENEM: 1.0000 | ENEMA | Freq: Once | RECTAL | Status: AC | PRN

## 2011-03-23 MED ORDER — FENTANYL CITRATE 0.05 MG/ML IJ SOLN
INTRAMUSCULAR | Status: DC | PRN
  Administered 2011-03-23: 100 ug via INTRAVENOUS
  Administered 2011-03-23: 50 ug via INTRAVENOUS
  Administered 2011-03-23: 100 ug via INTRAVENOUS
  Administered 2011-03-23: 50 ug via INTRAVENOUS

## 2011-03-23 MED ORDER — SODIUM CHLORIDE 0.9 % IV SOLN
INTRAVENOUS | Status: DC
  Administered 2011-03-23 – 2011-03-24 (×2): via INTRAVENOUS

## 2011-03-23 MED ORDER — MIDAZOLAM HCL 2 MG/2ML IJ SOLN
1.0000 mg | INTRAMUSCULAR | Status: DC | PRN
  Administered 2011-03-23: 2 mg via INTRAVENOUS

## 2011-03-23 MED ORDER — CEFAZOLIN SODIUM 1-5 GM-% IV SOLN
1.0000 g | Freq: Four times a day (QID) | INTRAVENOUS | Status: AC
  Administered 2011-03-23 – 2011-03-24 (×3): 1 g via INTRAVENOUS
  Filled 2011-03-23 (×3): qty 50

## 2011-03-23 MED ORDER — CEFAZOLIN SODIUM 1-5 GM-% IV SOLN
1.0000 g | Freq: Once | INTRAVENOUS | Status: AC
  Administered 2011-03-23: 1 g via INTRAVENOUS

## 2011-03-23 MED ORDER — DIPHENHYDRAMINE HCL 12.5 MG/5ML PO ELIX: 12.5000 mg | ORAL_SOLUTION | ORAL | Status: DC | PRN

## 2011-03-23 MED ORDER — TEMAZEPAM 15 MG PO CAPS: 15.0000 mg | ORAL_CAPSULE | Freq: Every evening | ORAL | Status: DC | PRN

## 2011-03-23 MED ORDER — BISACODYL 10 MG RE SUPP: 10.0000 mg | Freq: Every day | RECTAL | Status: DC | PRN

## 2011-03-23 MED ORDER — PROMETHAZINE HCL 25 MG/ML IJ SOLN: 6.2500 mg | INTRAMUSCULAR | Status: DC | PRN

## 2011-03-23 MED ORDER — MORPHINE SULFATE 2 MG/ML IJ SOLN
INTRAMUSCULAR | Status: AC
  Administered 2011-03-23: 1 mg via INTRAVENOUS
  Filled 2011-03-23: qty 1

## 2011-03-23 MED ORDER — METOCLOPRAMIDE HCL 10 MG PO TABS: 5.0000 mg | ORAL_TABLET | Freq: Three times a day (TID) | ORAL | Status: DC | PRN

## 2011-03-23 MED ORDER — POLYETHYLENE GLYCOL 3350 17 G PO PACK
17.0000 g | PACK | Freq: Every day | ORAL | Status: DC | PRN
  Filled 2011-03-23: qty 1

## 2011-03-23 MED ORDER — CHLORHEXIDINE GLUCONATE 4 % EX LIQD: 60.0000 mL | Freq: Once | CUTANEOUS | Status: DC

## 2011-03-23 SURGICAL SUPPLY — 51 items
BAG SPEC THK2 15X12 ZIP CLS (MISCELLANEOUS) ×1
BAG ZIPLOCK 12X15 (MISCELLANEOUS) ×2 IMPLANT
BIT DRILL 2.8X128 (BIT) ×2 IMPLANT
BLADE EXTENDED COATED 6.5IN (ELECTRODE) ×2 IMPLANT
BLADE SAW SAG 73X25 THK (BLADE) ×1
BLADE SAW SGTL 73X25 THK (BLADE) ×1 IMPLANT
CLOSURE STERI STRIP 1/2 X4 (GAUZE/BANDAGES/DRESSINGS) ×1 IMPLANT
CLOTH BEACON ORANGE TIMEOUT ST (SAFETY) ×2 IMPLANT
DECANTER SPIKE VIAL GLASS SM (MISCELLANEOUS) ×2 IMPLANT
DRAPE INCISE IOBAN 66X45 STRL (DRAPES) ×2 IMPLANT
DRAPE ORTHO SPLIT 77X108 STRL (DRAPES) ×4
DRAPE POUCH INSTRU U-SHP 10X18 (DRAPES) ×2 IMPLANT
DRAPE SURG ORHT 6 SPLT 77X108 (DRAPES) ×2 IMPLANT
DRAPE U-SHAPE 47X51 STRL (DRAPES) ×2 IMPLANT
DRSG ADAPTIC 3X8 NADH LF (GAUZE/BANDAGES/DRESSINGS) ×2 IMPLANT
DRSG MEPILEX BORDER 4X4 (GAUZE/BANDAGES/DRESSINGS) ×2 IMPLANT
DRSG MEPILEX BORDER 4X8 (GAUZE/BANDAGES/DRESSINGS) ×2 IMPLANT
DURAPREP 26ML APPLICATOR (WOUND CARE) ×2 IMPLANT
ELECT REM PT RETURN 9FT ADLT (ELECTROSURGICAL) ×2
ELECTRODE REM PT RTRN 9FT ADLT (ELECTROSURGICAL) ×1 IMPLANT
EVACUATOR 1/8 PVC DRAIN (DRAIN) ×2 IMPLANT
FACESHIELD LNG OPTICON STERILE (SAFETY) ×8 IMPLANT
GLOVE BIO SURGEON STRL SZ7.5 (GLOVE) ×2 IMPLANT
GLOVE BIO SURGEON STRL SZ8 (GLOVE) ×2 IMPLANT
GLOVE BIOGEL PI IND STRL 8 (GLOVE) ×2 IMPLANT
GLOVE BIOGEL PI INDICATOR 8 (GLOVE) ×2
GOWN STRL NON-REIN LRG LVL3 (GOWN DISPOSABLE) ×2 IMPLANT
GOWN STRL REIN XL XLG (GOWN DISPOSABLE) ×2 IMPLANT
IMMOBILIZER KNEE 20 (SOFTGOODS) ×2
IMMOBILIZER KNEE 20 THIGH 36 (SOFTGOODS) IMPLANT
KIT BASIN OR (CUSTOM PROCEDURE TRAY) ×2 IMPLANT
MANIFOLD NEPTUNE II (INSTRUMENTS) ×2 IMPLANT
NDL SAFETY ECLIPSE 18X1.5 (NEEDLE) ×1 IMPLANT
NEEDLE HYPO 18GX1.5 SHARP (NEEDLE) ×2
NS IRRIG 1000ML POUR BTL (IV SOLUTION) ×2 IMPLANT
PACK TOTAL JOINT (CUSTOM PROCEDURE TRAY) ×2 IMPLANT
PASSER SUT SWANSON 36MM LOOP (INSTRUMENTS) ×2 IMPLANT
POSITIONER SURGICAL ARM (MISCELLANEOUS) ×2 IMPLANT
SPONGE GAUZE 4X4 12PLY (GAUZE/BANDAGES/DRESSINGS) ×2 IMPLANT
STRIP CLOSURE SKIN 1/2X4 (GAUZE/BANDAGES/DRESSINGS) ×4 IMPLANT
SUT ETHIBOND NAB CT1 #1 30IN (SUTURE) ×4 IMPLANT
SUT MNCRL AB 4-0 PS2 18 (SUTURE) ×2 IMPLANT
SUT VIC AB 1 CT1 27 (SUTURE) ×6
SUT VIC AB 1 CT1 27XBRD ANTBC (SUTURE) ×3 IMPLANT
SUT VIC AB 2-0 CT1 27 (SUTURE) ×6
SUT VIC AB 2-0 CT1 TAPERPNT 27 (SUTURE) ×3 IMPLANT
SYR 50ML LL SCALE MARK (SYRINGE) ×2 IMPLANT
TOWEL OR 17X26 10 PK STRL BLUE (TOWEL DISPOSABLE) ×4 IMPLANT
TOWEL OR NON WOVEN STRL DISP B (DISPOSABLE) ×2 IMPLANT
TRAY FOLEY CATH 14FRSI W/METER (CATHETERS) ×2 IMPLANT
WATER STERILE IRR 1500ML POUR (IV SOLUTION) ×2 IMPLANT

## 2011-03-23 NOTE — Op Note (Signed)
Pre-operative diagnosis- Osteoarthritis Left hip  Post-operative diagnosis- Osteoarthritis  Left hip  Procedure-  LeftTotal Hip Arthroplasty  Surgeon- Gus Rankin. Abrey Bradway, MD  Assistant- Avel Peace, PA-C   Anesthesia  General  EBL- 300   Drain Hemovac   Complication- None  Condition-PACU - hemodynamically stable.   Brief Clinical Note-  Mindy Snyder is a 61 y.o. female with end stage arthritis of her left hip with progressively worsening pain and dysfunction. Pain occurs with activity and rest including pain at night. She has tried analgesics, protected weight bearing and rest without benefit. Pain is too severe to attempt physical therapy. Radiographs demonstrate bone on bone arthritis with subchondral cyst formation. She presents now for left THA.  Procedure in detail-   The patient is brought into the operating room and placed on the operating table. After successful administration of General  anesthesia, the patient is placed in the  Right lateral decubitus position with the  Left side up and held in place with the hip positioner. The lower extremity is isolated from the perineum with plastic drapes and time-out is performed by the surgical team. The lower extremity is then prepped and draped in the usual sterile fashion. A short posterolateral incision is made with a ten blade through the subcutaneous tissue to the level of the fascia lata which is incised in line with the skin incision. The sciatic nerve is palpated and protected and the short external rotators and capsule are isolated from the femur. The hip is then dislocated and the center of the femoral head is marked. A trial prosthesis is placed such that the trial head corresponds to the center of the patients' native femoral head. The resection level is marked on the femoral neck and the resection is made with an oscillating saw. The femoral head is removed and femoral retractors placed to gain access to the femoral  canal.      The canal finder is passed into the femoral canal and the canal is thoroughly irrigated with sterile saline to remove the fatty contents. Axial reaming is performed to 13.5  mm, proximal reaming to 66F  and the sleeve machined to a small. A 66F small trial sleeve is placed into the proximal femur.      The femur is then retracted anteriorly to gain acetabular exposure. Acetabular retractors are placed and the labrum and osteophytes are removed, Acetabular reaming is performed to 51  mm and a 52  mm Pinnacle acetabular shell is placed in anatomic position with excellent purchase. Additional dome screws were not needed. An apex hole eliminator is placed and the permanent 32 mm neutral plus 4 Marathon liner is placed into the acetabular shell.      The trial femur is then placed into the femoral canal. The size is 18 x 13  stem with a 36 + 8  neck and a 32 + 0 head with the neck version 20 degrees beyond the patients' native anteversion. Her native version was neutral to slight retroversion. The hip is reduced with excellent stability with full extension and full external rotation, 70 degrees flexion with 40 degrees adduction and 90 degrees internal rotation and 90 degrees of flexion with 70 degrees of internal rotation. The operative leg is placed on top of the non-operative leg and the leg lengths are found to be equal. The trials are then removed and the permanent implant of the same size is impacted into the femoral canal. The ceramic femoral head of the  same size as the trial is placed and the hip is reduced with the same stability parameters. The operative leg is again placed on top of the non-operative leg and the leg lengths are found to be equal.      The wound is then copiously irrigated with saline solution and the capsule and short external rotators are re-attached to the femur through drill holes with Ethibond suture. The fascia lata is closed over a hemovac drain with #1 vicryl suture and  the fascia lata, gluteal muscles and subcutaneous tissues are injected with Exparel 20ml diluted with saline 50ml. The subcutaneous tissues are closed with #1 and2-0 vicryl and the subcuticular layer closed with running 4-0 Monocryl. The drain is hooked to suction, incision cleaned and dried, and steri-srips and a bulky sterile dressing applied. The limb is placed into a knee immobilizer and the patient is awakened and transported to recovery in stable condition.      Please note that a surgical assistant was a medical necessity for this procedure in order to perform it in a safe and expeditious manner. The assistant was necessary to provide retraction to the vital neurovascular structures and to retract and position the limb to allow for anatomic placement of the prosthetic components.  Gus Rankin Willi Borowiak, MD    03/23/2011, 5:57 PM

## 2011-03-23 NOTE — Anesthesia Postprocedure Evaluation (Signed)
  Anesthesia Post-op Note  Patient: Mindy Snyder  Procedure(s) Performed:  TOTAL HIP ARTHROPLASTY  Patient Location: PACU  Anesthesia Type: General  Level of Consciousness: awake and alert   Airway and Oxygen Therapy: Patient Spontanous Breathing  Post-op Pain: mild  Post-op Assessment: Post-op Vital signs reviewed, Patient's Cardiovascular Status Stable, Respiratory Function Stable, Patent Airway and No signs of Nausea or vomiting  Post-op Vital Signs: stable  Complications: No apparent anesthesia complications

## 2011-03-23 NOTE — Anesthesia Preprocedure Evaluation (Signed)
Anesthesia Evaluation  Patient identified by MRN, date of birth, ID band Patient awake    Reviewed: Allergy & Precautions, H&P , NPO status , Patient's Chart, lab work & pertinent test results  Airway Mallampati: II TM Distance: >3 FB Neck ROM: Full    Dental No notable dental hx. (+) Caps and Dental Advisory Given   Pulmonary former smoker clear to auscultation  Pulmonary exam normal       Cardiovascular neg cardio ROS Regular Normal    Neuro/Psych Negative Neurological ROS  Negative Psych ROS   GI/Hepatic negative GI ROS, Neg liver ROS,   Endo/Other  Negative Endocrine ROS  Renal/GU negative Renal ROS  Genitourinary negative   Musculoskeletal negative musculoskeletal ROS (+)   Abdominal   Peds negative pediatric ROS (+)  Hematology negative hematology ROS (+)   Anesthesia Other Findings   Reproductive/Obstetrics negative OB ROS                           Anesthesia Physical Anesthesia Plan  ASA: II  Anesthesia Plan: General   Post-op Pain Management:    Induction: Intravenous  Airway Management Planned: Oral ETT  Additional Equipment:   Intra-op Plan:   Post-operative Plan: Extubation in OR  Informed Consent: I have reviewed the patients History and Physical, chart, labs and discussed the procedure including the risks, benefits and alternatives for the proposed anesthesia with the patient or authorized representative who has indicated his/her understanding and acceptance.   Dental advisory given  Plan Discussed with: CRNA  Anesthesia Plan Comments:         Anesthesia Quick Evaluation

## 2011-03-23 NOTE — H&P (View-Only) (Signed)
Mindy Snyder  DOB: [birth year] Widowed / Race: White / Female  Date of Admission:  03/23/2011  Chief Complaint: Left Hip Pain  History of Present Illness The patient is a 61 year old female who comes in today for a preoperative History and Physical. The patient is scheduled for a left total hip arthroplasty to be performed by Dr. Gus Rankin. Aluisio, MD at [facility] on 03/23/2011. They have been treated conservatively in the past for the above stated problem and despite conservative measures, they continue to have progressive pain and severe functional limitations and dysfunction. They have failed non-operative management including home exercise, medications, and injections. It is felt that they would benefit from undergoing total joint replacement. Risks and benefits of the procedure have been discussed with the patient and they elect to proceed with surgery. There are no active contraindications to surgery such as ongoing infection or rapidly progressive neurological disease. The hip is having a much greater effect on her lifestyle. She hurts throughout the day when she is active. Occasionally she will hurt at night effecting her ability to do activities she desires and also ability to even do activities of daily living. She is ready to get the hip fixed.  Allergies NKDA  Intolerance Codeine Derivatives - Nausea.  Medication History Ibuprofen (800MG  Tablet, Oral as needed) Active.  Problem List/Past Medical Osteopenia Transfusion History - Past transfusion with previous right total hip surgery.  Past Surgical History Total Hip Replacement -Right  Family History Chronic Obstructive Lung Disease. mother Diabetes Mellitus. father and grandfather fathers side Hypertension. mother and father Osteoarthritis. mother, father and grandmother fathers side Osteoporosis. mother  Social History Alcohol use. current drinker; drinks wine; 5-7 per week Children.  2 Current work status. working full time Drug/Alcohol Rehab (Currently). no Drug/Alcohol Rehab (Previously). no Exercise. Exercises rarely Illicit drug use. no Living situation. live alone Marital status. widowed Number of flights of stairs before winded. 2-3 Pain Contract. no Tobacco / smoke exposure. no Tobacco use. Former smoker. pack a day Former smoker; smoke(d) 1 pack(s) per day Pregnant. no  Review of Systems General:Not Present- Chills, Fever, Night Sweats, Appetite Loss, Fatigue, Feeling sick, Weight Gain and Weight Loss. Skin:Not Present- Itching, Rash, Skin Color Changes, Ulcer, Psoriasis and Change in Hair or Nails. HEENT:Not Present- Sensitivity to light, Hearing problems, Nose Bleed and Ringing in the Ears. Neck:Not Present- Swollen Glands and Neck Mass. Respiratory:Not Present- Snoring, Chronic Cough, Bloody sputum and Dyspnea. Cardiovascular:Not Present- Shortness of Breath, Chest Pain, Swelling of Extremities, Leg Cramps and Palpitations. Gastrointestinal:Not Present- Bloody Stool, Heartburn, Abdominal Pain, Vomiting, Nausea and Incontinence of Stool. Female Genitourinary:Not Present- Blood in Urine, Menstrual Irregularities, Frequency, Incontinence and Nocturia. Musculoskeletal:Present- Joint Pain. Not Present- Muscle Weakness, Muscle Pain, Joint Stiffness, Joint Swelling and Back Pain. Psychiatric:Not Present- Anxiety, Depression and Memory Loss. Endocrine:Not Present- Cold Intolerance, Heat Intolerance, Excessive hunger and Excessive Thirst. Hematology:Not Present- Abnormal Bleeding, Anemia, Blood Clots and Easy Bruising.  Vitals Weight: 150 lb Height: 65 in Body Surface Area: 1.77 m Body Mass Index: 24.96 kg/m Pulse: 68 (Regular) Resp.: 14 (Unlabored) BP: 142/68 (Sitting, Left Arm, Standard)  Physical Exam The physical exam findings are as follows: Patient is a 61 year old female with continued hip pain. Patient is  accompanied today by her mother.  General Mental Status - Alert, cooperative and good historian. General Appearance- pleasant. Not in acute distress. Orientation- Oriented X3. Build & Nutrition- Well nourished and Well developed.  Head and Neck Head- normocephalic, atraumatic .  Neck Global Assessment- supple. no bruit auscultated on the right and no bruit auscultated on the left.  Eye Pupil- Bilateral- Regular and Round. Motion- Bilateral- EOMI.  Chest and Lung Exam Auscultation: Breath sounds:- clear at anterior chest wall and - clear at posterior chest wall. Adventitious sounds:- No Adventitious sounds.  Cardiovascular Auscultation:Rhythm- Regular rate and rhythm. Heart Sounds- S1 WNL and S2 WNL. Murmurs & Other Heart Sounds:Auscultation of the heart reveals - No Murmurs.  Abdomen Palpation/Percussion:Tenderness- Abdomen is non-tender to palpation. Rigidity (guarding)- Abdomen is soft. Auscultation:Auscultation of the abdomen reveals - Bowel sounds normal.  Female Genitourinary Not done, not pertinent to present illness  Musculoskeletal Her right hip has normal range of motion with no discomfort. Her left hip can be flexed to 90. No internal rotation. Minimal external rotation to about 20-30 degrees of abduction. She has pain within the rotational and abduction moves. Her knee exam is normal on the left. Pulse, sensation, and motor are intact. Gait pattern is antalgic on the left.  RADIOGRAPHS: AP pelvis, AP and lateral of the hip shows severe endstage arthritis of the left hip, bone on bone with flattening of the femoral head and subchondral cystic formation. There is slight advancement compared to x-rays from 6 months ago.  Assessment & Plan Osteoarthritis Left Hip  Patient is for a Left Total Hip Arthroplasty by Dr. Lequita Halt.  Plan is to go home after the surgery.  Avel Peace, PA-C

## 2011-03-23 NOTE — Interval H&P Note (Signed)
History and Physical Interval Note:  03/23/2011 4:26 PM  Mindy Snyder  has presented today for surgery, with the diagnosis of osteoarthritis left hip  The various methods of treatment have been discussed with the patient and family. After consideration of risks, benefits and other options for treatment, the patient has consented to  Procedure(s): TOTAL HIP ARTHROPLASTY as a surgical intervention .  The patients' history has been reviewed, patient examined, no change in status, stable for surgery.  I have reviewed the patients' chart and labs.  Questions were answered to the patient's satisfaction.     Loanne Drilling

## 2011-03-23 NOTE — Transfer of Care (Signed)
Immediate Anesthesia Transfer of Care Note  Patient: Mindy Snyder  Procedure(s) Performed:  TOTAL HIP ARTHROPLASTY  Patient Location: PACU  Anesthesia Type: General  Level of Consciousness: awake, sedated and patient cooperative  Airway & Oxygen Therapy: Patient Spontanous Breathing and Patient connected to face mask oxygen  Post-op Assessment: Report given to PACU RN and Post -op Vital signs reviewed and stable  Post vital signs: Reviewed and stable  Complications: No apparent anesthesia complications

## 2011-03-24 ENCOUNTER — Encounter (HOSPITAL_COMMUNITY): Payer: Self-pay | Admitting: *Deleted

## 2011-03-24 DIAGNOSIS — E871 Hypo-osmolality and hyponatremia: Secondary | ICD-10-CM | POA: Diagnosis not present

## 2011-03-24 LAB — CBC
MCH: 31.6 pg (ref 26.0–34.0)
MCHC: 33.7 g/dL (ref 30.0–36.0)
MCV: 94 fL (ref 78.0–100.0)
Platelets: 221 10*3/uL (ref 150–400)
RDW: 13.1 % (ref 11.5–15.5)

## 2011-03-24 LAB — BASIC METABOLIC PANEL
Calcium: 8.4 mg/dL (ref 8.4–10.5)
Creatinine, Ser: 0.56 mg/dL (ref 0.50–1.10)
GFR calc non Af Amer: >90 mL/min (ref >90)
Sodium: 134 mEq/L — ABNORMAL LOW (ref 135–145)

## 2011-03-24 MED ORDER — ALUM & MAG HYDROXIDE-SIMETH 200-200-20 MG/5ML PO SUSP
30.0000 mL | ORAL | Status: DC | PRN
  Administered 2011-03-24: 30 mL via ORAL
  Filled 2011-03-24: qty 30

## 2011-03-24 NOTE — Progress Notes (Signed)
  CARE MANAGEMENT NOTE 03/24/2011  Patient:  Mindy Snyder, Mindy Snyder   Account Number:  [id number]  Date Initiated:  03/24/2011  Documentation initiated by:  Colleen Can  Subjective/Objective Assessment:   dx osteoarthritis left hip; total hip replacemnt     Action/Plan:   CM spoke with patient. Patient plans to go to her mothers home where her parents will be caregivers upon discharge. She is requesting Genevieve Norlander for [facility] services. No DME needs at this time   Anticipated DC Date:  03/26/2011   Anticipated DC Plan:  HOME W HOME HEALTH SERVICES  In-house referral  NA      DC Planning Services  CM consult      [facility] Choice  HOME HEALTH   Choice offered to / List presented to:  C-1 Patient   DME arranged  NA      DME agency  NA     HH arranged  HH-2 PT      [facility] agency  [facility]   Status of service:  In process, will continue to follow Medicare Important Message given?  NO (If response is "NO", the following Medicare IM given date fields will be blank) Comments:  List of HH agencies placed in shadow chart

## 2011-03-24 NOTE — Progress Notes (Signed)
Subjective: 1 Day Post-Op Procedure(s) (LRB): TOTAL HIP ARTHROPLASTY (Left) Patient reports pain as mild.   Patient seen in rounds with Dr. Lequita Halt. Patient doing fine on day one. We will start therapy today. Plan is to go home after hospital stay.  Objective: Vital signs in last 24 hours: Temp:  [97.2 F (36.2 C)-99.5 F (37.5 C)] 98.2 F (36.8 C) (01/24 0548) Pulse Rate:  [66-90] 74  (01/24 0548) Resp:  [13-18] 16  (01/24 0548) BP: (107-168)/(67-121) 118/72 mmHg (01/24 0548) SpO2:  [96 %-100 %] 98 % (01/24 0548)  Intake/Output from previous day:  Intake/Output Summary (Last 24 hours) at 03/24/11 0851 Last data filed at 03/24/11 0700  Gross per 24 hour  Intake 2851.66 ml  Output   2330 ml  Net 521.66 ml    Intake/Output this shift:    Labs:  Basename 03/24/11 0350  HGB 10.0*    Basename 03/24/11 0350  WBC 8.8  RBC 3.16*  HCT 29.7*  PLT 221    Basename 03/24/11 0350  NA 134*  K 4.1  CL 105  CO2 22  BUN 7  CREATININE 0.56  GLUCOSE 248*  CALCIUM 8.4   No results found for this basename: LABPT:2,INR:2 in the last 72 hours  Exam - Neurovascular intact Sensation intact distally Dressing - clean, dry, no drainage Motor function intact - moving foot and toes well on exam.  Hemovac pulled without difficulty.  Past Medical History  Diagnosis Date  . Blood transfusion 2002    with previous hip replacement   . Arthritis     pain and oa left hip-plans hip replacement  -- s/p right hip replacement -doing well.  pt has arthritis pain lower back, ankles, wrists and "everywhere"    Assessment/Plan: 1 Day Post-Op Procedure(s) (LRB): TOTAL HIP ARTHROPLASTY (Left) Principal Problem:  *Osteoarthritis of hip Active Problems:  Postop Hyponatremia   Advance diet Up with therapy Discharge home with home health when met goals.  DVT Prophylaxis - Xarelto  Protocol Partial-Weight Bearing 25-50% left Leg D/C Knee Immobilizer Hemovac Pulled Begin Therapy Hip  Preacutions Keep foley until tomorrow. No vaccines.  Fredia Chittenden 03/24/2011, 8:51 AM

## 2011-03-24 NOTE — Evaluation (Signed)
Physical Therapy Evaluation Patient Details Name: Mindy Snyder MRN: [id number] DOB: 01/16/1951 Today's Date: 03/24/2011  Problem List:  Patient Active Problem List  Diagnoses  . Osteoarthritis of hip  . Postop Hyponatremia    Past Medical History:  Past Medical History  Diagnosis Date  . Blood transfusion 2002    with previous hip replacement   . Arthritis     pain and oa left hip-plans hip replacement  -- s/p right hip replacement -doing well.  pt has arthritis pain lower back, ankles, wrists and "everywhere"   Past Surgical History:  Past Surgical History  Procedure Date  . Joint replacement 2002     right hip arthroplasty  . Breast surgery     benign cyst removed from breast age 61    PT Assessment/Plan/Recommendation PT Assessment Clinical Impression Statement: PT with L THR presents with decreased L LE strength and ltd functiional mobility..  Pt will benefit from skilled PT intervention to maximize IND for d/c home with parents. PT Recommendation/Assessment: Patient will need skilled PT in the acute care venue PT Problem List: Decreased strength;Decreased range of motion;Decreased activity tolerance;Decreased mobility;Decreased knowledge of use of DME;Pain PT Therapy Diagnosis : Difficulty walking PT Plan PT Frequency: 7X/week PT Treatment/Interventions: DME instruction;Gait training;Stair training;Functional mobility training;Therapeutic activities;Therapeutic exercise;Patient/family education PT Recommendation Recommendations for Other Services: OT consult Follow Up Recommendations: Home health PT Equipment Recommended: Rolling walker with 5" wheels PT Goals  Acute Rehab PT Goals PT Goal Formulation: With patient Time For Goal Achievement: 7 days Pt will go Supine/Side to Sit: with supervision PT Goal: Supine/Side to Sit - Progress: Goal set today Pt will go Sit to Supine/Side: with supervision PT Goal: Sit to Supine/Side - Progress: Goal set today Pt  will go Sit to Stand: with supervision PT Goal: Sit to Stand - Progress: Goal set today Pt will go Stand to Sit: with supervision PT Goal: Stand to Sit - Progress: Goal set today Pt will Ambulate: 51 - 150 feet;with rolling walker;with supervision PT Goal: Ambulate - Progress: Goal set today Pt will Go Up / Down Stairs: 3-5 stairs;with min assist;with least restrictive assistive device PT Goal: Up/Down Stairs - Progress: Goal set today  PT Evaluation Precautions/Restrictions  Precautions Precautions: Posterior Hip Restrictions Weight Bearing Restrictions: No LUE Weight Bearing: Partial weight bearing LUE Partial Weight Bearing Percentage or Pounds: 25 - 50% Prior Functioning  Home Living Lives With: Alone (will be staying with parents) Receives Help From: Family Type of Home: House Home Layout: One level Home Access: Stairs to enter Entrance Stairs-Rails: Left Entrance Stairs-Number of Steps: 5 Prior Function Level of Independence: Independent with basic ADLs;Independent with transfers;Independent with gait Able to Take Stairs?: Yes Cognition Cognition Arousal/Alertness: Awake/alert Overall Cognitive Status: Appears within functional limits for tasks assessed Orientation Level: Oriented X4 Sensation/Coordination Coordination Gross Motor Movements are Fluid and Coordinated: Yes Extremity Assessment RUE Assessment RUE Assessment: Within Functional Limits LUE Assessment LUE Assessment: Within Functional Limits RLE Assessment RLE Assessment: Within Functional Limits LLE Assessment LLE Assessment: Exceptions to WFL (AAROM WFL following THP; hip3-/5, quads 3+/5) Mobility (including Balance) Bed Mobility Bed Mobility: Yes Supine to Sit: 4: Min assist;3: Mod assist Supine to Sit Details (indicate cue type and reason): cues for use of LE, sequence and THP Transfers Transfers: Yes Sit to Stand: 4: Min assist;3: Mod assist;With upper extremity assist;From bed Sit to Stand  Details (indicate cue type and reason): cues for use of UEs and for LE position Stand to Sit: 4:  Min assist;3: Mod assist;To chair/3-in-1;With armrests;With upper extremity assist Stand to Sit Details: cues for use of UEs and for LE position Ambulation/Gait Ambulation/Gait: Yes Ambulation/Gait Assistance: 4: Min assist;3: Mod assist Ambulation/Gait Assistance Details (indicate cue type and reason): cues for sequence, posture, position from RW, and increased UE WB Ambulation Distance (Feet): 41 Feet Assistive device: Rolling walker Gait Pattern: Step-to pattern    Exercise  Total Joint Exercises Ankle Circles/Pumps: AROM;10 reps;Supine;Both Quad Sets: AROM;10 reps;Supine;Both Heel Slides: 10 reps;AAROM;Left;Supine Hip ABduction/ADduction: AAROM;Left;10 reps;Supine End of Session PT - End of Session Equipment Utilized During Treatment: Gait belt Activity Tolerance: Patient tolerated treatment well Patient left: in chair;with call bell in reach Nurse Communication: Mobility status for transfers;Mobility status for ambulation General Behavior During Session: [facility] for tasks performed Cognition: [facility] for tasks performed  Athea Haley 03/24/2011, 12:03 PM

## 2011-03-24 NOTE — Progress Notes (Signed)
Physical Therapy Treatment Patient Details Name: Crystallee Werden MRN: [id number] DOB: 1950-08-13 Today's Date: 03/24/2011  PT Assessment/Plan  PT - Assessment/Plan PT Plan: Discharge plan remains appropriate PT Frequency: 7X/week Recommendations for Other Services: OT consult Follow Up Recommendations: Home health PT Equipment Recommended: Rolling walker with 5" wheels PT Goals  Acute Rehab PT Goals PT Goal Formulation: With patient Time For Goal Achievement: 7 days Pt will go Supine/Side to Sit: with supervision PT Goal: Supine/Side to Sit - Progress: Progressing toward goal Pt will go Sit to Supine/Side: with supervision PT Goal: Sit to Supine/Side - Progress: Progressing toward goal Pt will go Sit to Stand: with supervision PT Goal: Sit to Stand - Progress: Progressing toward goal Pt will go Stand to Sit: with supervision PT Goal: Stand to Sit - Progress: Progressing toward goal Pt will Ambulate: 51 - 150 feet;with rolling walker;with supervision PT Goal: Ambulate - Progress: Progressing toward goal  PT Treatment Precautions/Restrictions  Precautions Precautions: Posterior Hip Restrictions Weight Bearing Restrictions: Yes LUE Weight Bearing: Partial weight bearing LUE Partial Weight Bearing Percentage or Pounds: 25 - 50% Mobility (including Balance) Bed Mobility Supine to Sit: 4: Min assist;3: Mod assist Supine to Sit Details (indicate cue type and reason): Cues for sequence/technique and THP Sit to Supine: 4: Min assist;3: Mod assist;HOB flat Sit to Supine - Details (indicate cue type and reason): cues for sequence/technique and THP Transfers Sit to Stand: 4: Min assist;With upper extremity assist;With armrests;From chair/3-in-1 Sit to Stand Details (indicate cue type and reason): cues for use of UEs and for LE managment to follow THP Stand to Sit: 4: Min assist;With upper extremity assist;To bed Stand to Sit Details: cues for use of UEs and for LE managment to  follow THP Ambulation/Gait Ambulation/Gait Assistance: 4: Min assist Ambulation/Gait Assistance Details (indicate cue type and reason): cues for sequence, posture and position from RW Ambulation Distance (Feet): 5 Feet Assistive device: Rolling walker Gait Pattern: Step-to pattern    Exercise    End of Session PT - End of Session Activity Tolerance: Patient tolerated treatment well Patient left: in bed;with call bell in reach Nurse Communication: Mobility status for transfers;Mobility status for ambulation General Behavior During Session: [facility] for tasks performed Cognition: [facility] for tasks performed  Mindy Snyder 03/24/2011, 3:54 PM

## 2011-03-24 NOTE — Progress Notes (Signed)
Physical Therapy Treatment Patient Details Name: Mindy Snyder MRN: [id number] DOB: [birth year] Today's Date: 03/24/2011  PT Assessment/Plan  PT - Assessment/Plan PT Plan: Discharge plan remains appropriate PT Frequency: 7X/week Recommendations for Other Services: OT consult Follow Up Recommendations: Home health PT Equipment Recommended: Rolling walker with 5" wheels PT Goals  Acute Rehab PT Goals PT Goal Formulation: With patient Time For Goal Achievement: 7 days Pt will go Supine/Side to Sit: with supervision PT Goal: Supine/Side to Sit - Progress: Progressing toward goal Pt will go Sit to Stand: with supervision PT Goal: Sit to Stand - Progress: Progressing toward goal Pt will go Stand to Sit: with supervision PT Goal: Stand to Sit - Progress: Progressing toward goal Pt will Ambulate: 51 - 150 feet;with rolling walker;with supervision PT Goal: Ambulate - Progress: Progressing toward goal  PT Treatment Precautions/Restrictions  Precautions Precautions: Posterior Hip Restrictions Weight Bearing Restrictions: Yes LUE Weight Bearing: Partial weight bearing LUE Partial Weight Bearing Percentage or Pounds: 25 - 50% Mobility (including Balance) Bed Mobility Supine to Sit: 4: Min assist;3: Mod assist Supine to Sit Details (indicate cue type and reason): Cues for sequence/technique and THP Transfers Sit to Stand: 4: Min assist;From bed;With upper extremity assist Sit to Stand Details (indicate cue type and reason): cues for use of UEs and for LE position Stand to Sit: 4: Min assist;To chair/3-in-1;With upper extremity assist;With armrests Stand to Sit Details: cues for use of UEs and for LE position Ambulation/Gait Ambulation/Gait Assistance: 4: Min assist Ambulation/Gait Assistance Details (indicate cue type and reason): cues for sequence, posture, position from RW, and increased UE WB Ambulation Distance (Feet): 120 Feet Assistive device: Rolling walker Gait Pattern:  Step-to pattern    Exercise    End of Session PT - End of Session Activity Tolerance: Patient tolerated treatment well Patient left: in chair;with call bell in reach Nurse Communication: Mobility status for transfers;Mobility status for ambulation General Behavior During Session: [facility] for tasks performed Cognition: [facility] for tasks performed  Damion Kant 03/24/2011, 3:48 PM

## 2011-03-25 ENCOUNTER — Encounter (HOSPITAL_COMMUNITY): Payer: Self-pay | Admitting: Orthopedic Surgery

## 2011-03-25 DIAGNOSIS — D62 Acute posthemorrhagic anemia: Secondary | ICD-10-CM | POA: Diagnosis not present

## 2011-03-25 LAB — PREPARE RBC (CROSSMATCH)

## 2011-03-25 LAB — CBC
MCH: 32.1 pg (ref 26.0–34.0)
Platelets: 176 10*3/uL (ref 150–400)
RBC: 2.52 MIL/uL — ABNORMAL LOW (ref 3.87–5.11)
RDW: 13.4 % (ref 11.5–15.5)
WBC: 5.8 10*3/uL (ref 4.0–10.5)

## 2011-03-25 LAB — BASIC METABOLIC PANEL
CO2: 24 mEq/L (ref 19–32)
Calcium: 8.2 mg/dL — ABNORMAL LOW (ref 8.4–10.5)
GFR calc non Af Amer: >90 mL/min (ref >90)
Glucose, Bld: 110 mg/dL — ABNORMAL HIGH (ref 70–99)
Potassium: 3.6 mEq/L (ref 3.5–5.1)
Sodium: 139 mEq/L (ref 135–145)

## 2011-03-25 MED ORDER — FUROSEMIDE 10 MG/ML IJ SOLN
10.0000 mg | Freq: Once | INTRAMUSCULAR | Status: AC
  Administered 2011-03-25: 10 mg via INTRAVENOUS
  Filled 2011-03-25: qty 1

## 2011-03-25 MED ORDER — POLYSACCHARIDE IRON 150 MG PO CAPS
150.0000 mg | ORAL_CAPSULE | Freq: Every day | ORAL | Status: DC
  Administered 2011-03-25 – 2011-03-26 (×2): 150 mg via ORAL
  Filled 2011-03-25 (×2): qty 1

## 2011-03-25 MED ORDER — RIVAROXABAN 10 MG PO TABS: 10.0000 mg | ORAL_TABLET | Freq: Every day | ORAL | Status: DC

## 2011-03-25 MED ORDER — OXYCODONE HCL 5 MG PO TABS: 5.0000 mg | ORAL_TABLET | ORAL | Status: AC | PRN

## 2011-03-25 MED ORDER — METHOCARBAMOL 500 MG PO TABS: 500.0000 mg | ORAL_TABLET | Freq: Four times a day (QID) | ORAL | Status: AC | PRN

## 2011-03-25 NOTE — Progress Notes (Signed)
Physical Therapy Treatment Patient Details Name: Mindy Snyder MRN: [id number] DOB: [birth year] Today's Date: 03/25/2011  PT Assessment/Plan  PT - Assessment/Plan PT Plan: Discharge plan remains appropriate PT Frequency: 7X/week Recommendations for Other Services: OT consult Follow Up Recommendations: Home health PT Equipment Recommended: Rolling walker with 5" wheels PT Goals  Acute Rehab PT Goals PT Goal Formulation: With patient Time For Goal Achievement: 7 days Pt will go Supine/Side to Sit: with supervision PT Goal: Supine/Side to Sit - Progress: Progressing toward goal Pt will go Sit to Supine/Side: with supervision PT Goal: Sit to Supine/Side - Progress: Progressing toward goal Pt will go Sit to Stand: with supervision PT Goal: Sit to Stand - Progress: Progressing toward goal Pt will go Stand to Sit: with supervision PT Goal: Stand to Sit - Progress: Progressing toward goal Pt will Ambulate: 51 - 150 feet;with rolling walker;with supervision PT Goal: Ambulate - Progress: Progressing toward goal Pt will Go Up / Down Stairs: 3-5 stairs;with min assist;with least restrictive assistive device  PT Treatment Precautions/Restrictions  Precautions Precautions: Posterior Hip Restrictions Weight Bearing Restrictions: No LUE Weight Bearing: Partial weight bearing LUE Partial Weight Bearing Percentage or Pounds: 25 - 50% Mobility (including Balance) Bed Mobility Bed Mobility: Yes Supine to Sit: 4: Min assist Supine to Sit Details (indicate cue type and reason): cues for sequence/technique and THP Sit to Supine: 4: Min assist Sit to Supine - Details (indicate cue type and reason): cues for sequence/technique and use of UEs Transfers Sit to Stand: 4: Min assist;With upper extremity assist;With armrests;From chair/3-in-1 Sit to Stand Details (indicate cue type and reason): cues for use of UEs Stand to Sit: 4: Min assist;With upper extremity assist;To bed Stand to Sit  Details: cues for use of UEs and for LE position Ambulation/Gait Ambulation/Gait Assistance: 4: Min assist Ambulation/Gait Assistance Details (indicate cue type and reason): cues for sequence, posture and position from RW Ambulation Distance (Feet): 60 Feet Assistive device: Rolling walker Gait Pattern: Step-to pattern    Exercise  Total Joint Exercises Ankle Circles/Pumps: AROM;20 reps;Both;Supine Short Arc Quad: 5 reps;10 reps;Left;AAROM;AROM;Supine Heel Slides: AAROM;20 reps;Supine;Left Hip ABduction/ADduction: AAROM;20 reps;Left;Supine End of Session PT - End of Session Equipment Utilized During Treatment: Gait belt Activity Tolerance: Patient tolerated treatment well;Patient limited by fatigue Patient left: in bed;with call bell in reach Nurse Communication: Mobility status for transfers;Mobility status for ambulation General Behavior During Session: [facility] for tasks performed Cognition: [facility] for tasks performed  Mindy Snyder 03/25/2011, 2:45 PM

## 2011-03-25 NOTE — Progress Notes (Signed)
Physical Therapy Treatment Patient Details Name: Mindy Snyder MRN: [id number] DOB: 1950-06-02 Today's Date: 03/25/2011  PT Assessment/Plan  PT - Assessment/Plan PT Plan: Discharge plan remains appropriate PT Frequency: 7X/week Recommendations for Other Services: OT consult Follow Up Recommendations: Home health PT Equipment Recommended: Rolling walker with 5" wheels PT Goals  Acute Rehab PT Goals PT Goal Formulation: With patient Time For Goal Achievement: 7 days Pt will go Supine/Side to Sit: with supervision PT Goal: Supine/Side to Sit - Progress: Progressing toward goal Pt will go Sit to Supine/Side: with supervision PT Goal: Sit to Supine/Side - Progress: Progressing toward goal Pt will go Sit to Stand: with supervision PT Goal: Sit to Stand - Progress: Progressing toward goal Pt will go Stand to Sit: with supervision PT Goal: Stand to Sit - Progress: Progressing toward goal Pt will Ambulate: 51 - 150 feet;with rolling walker;with supervision PT Goal: Ambulate - Progress: Progressing toward goal Pt will Go Up / Down Stairs: 3-5 stairs;with min assist;with least restrictive assistive device  PT Treatment Precautions/Restrictions  Precautions Precautions: Posterior Hip Restrictions Weight Bearing Restrictions: No LUE Weight Bearing: Partial weight bearing LUE Partial Weight Bearing Percentage or Pounds: 25 - 50% Mobility (including Balance) Bed Mobility Bed Mobility: Yes Supine to Sit: 4: Min assist Supine to Sit Details (indicate cue type and reason): cues for sequence/technique and THP Sit to Supine: 4: Min assist Sit to Supine - Details (indicate cue type and reason): cues for sequence/technique and use of UEs Transfers Sit to Stand: 4: Min assist;With upper extremity assist;With armrests;From chair/3-in-1 Sit to Stand Details (indicate cue type and reason): cues for use of UEs Stand to Sit: 4: Min assist;With upper extremity assist;To bed Stand to Sit  Details: cues for use of UEs and for LE position Ambulation/Gait Ambulation/Gait Assistance: 4: Min assist Ambulation/Gait Assistance Details (indicate cue type and reason): cues for sequence, posture and position from RW Ambulation Distance (Feet): 60 Feet Assistive device: Rolling walker Gait Pattern: Step-to pattern    Exercise    End of Session PT - End of Session Equipment Utilized During Treatment: Gait belt Activity Tolerance: Patient tolerated treatment well;Patient limited by fatigue Patient left: in bed;with call bell in reach Nurse Communication: Mobility status for transfers;Mobility status for ambulation General Behavior During Session: [facility] for tasks performed Cognition: [facility] for tasks performed  Mindy Snyder 03/25/2011, 2:42 PM

## 2011-03-25 NOTE — Progress Notes (Signed)
Physical Therapy Treatment Patient Details Name: Mindy Snyder MRN: [id number] DOB: September 24, 1950 Today's Date: 03/25/2011  PT Assessment/Plan  PT - Assessment/Plan PT Plan: Discharge plan remains appropriate PT Frequency: 7X/week Recommendations for Other Services: OT consult Follow Up Recommendations: Home health PT Equipment Recommended: Rolling walker with 5" wheels PT Goals  Acute Rehab PT Goals PT Goal Formulation: With patient Time For Goal Achievement: 7 days Pt will go Supine/Side to Sit: with supervision Pt will go Sit to Supine/Side: with supervision PT Goal: Sit to Supine/Side - Progress: Progressing toward goal Pt will go Sit to Stand: with supervision PT Goal: Sit to Stand - Progress: Progressing toward goal Pt will go Stand to Sit: with supervision PT Goal: Stand to Sit - Progress: Progressing toward goal Pt will Ambulate: 51 - 150 feet;with rolling walker;with supervision PT Goal: Ambulate - Progress: Progressing toward goal Pt will Go Up / Down Stairs: 3-5 stairs;with min assist;with least restrictive assistive device  PT Treatment Precautions/Restrictions  Precautions Precautions: Posterior Hip Restrictions Weight Bearing Restrictions: No LUE Weight Bearing: Partial weight bearing LUE Partial Weight Bearing Percentage or Pounds: 25 - 50% Mobility (including Balance) Bed Mobility Sit to Supine: 4: Min assist;3: Mod assist;HOB flat Sit to Supine - Details (indicate cue type and reason): cues for sequence/technique and THP Transfers Sit to Stand: 4: Min assist;With upper extremity assist;With armrests;From chair/3-in-1 Sit to Stand Details (indicate cue type and reason): cues for use of UEs and for LE managment to follow THP Stand to Sit: 4: Min assist;With upper extremity assist;To bed Stand to Sit Details: cues for use of UEs and for LE managment to follow THP Ambulation/Gait Ambulation/Gait Assistance: 4: Min assist Ambulation/Gait Assistance Details  (indicate cue type and reason): cues for sequence, posture and position from RW Ambulation Distance (Feet): 60 Feet Assistive device: Rolling walker Gait Pattern: Step-to pattern    Exercise    End of Session PT - End of Session Equipment Utilized During Treatment: Gait belt Activity Tolerance: Patient tolerated treatment well;Patient limited by fatigue Patient left: in bed;with call bell in reach Nurse Communication: Mobility status for transfers;Mobility status for ambulation General Behavior During Session: [facility] for tasks performed Cognition: [facility] for tasks performed  Mindy Snyder 03/25/2011, 2:35 PM

## 2011-03-25 NOTE — Progress Notes (Signed)
Physical Therapy Treatment Patient Details Name: Mindy Snyder MRN: [id number] DOB: [birth year] Today's Date: 03/25/2011  PT Assessment/Plan  PT - Assessment/Plan PT Plan: Discharge plan remains appropriate PT Frequency: 7X/week Recommendations for Other Services: OT consult Follow Up Recommendations: Home health PT Equipment Recommended: Rolling walker with 5" wheels PT Goals  Acute Rehab PT Goals PT Goal Formulation: With patient Time For Goal Achievement: 7 days Pt will go Supine/Side to Sit: with supervision Pt will go Sit to Supine/Side: with supervision PT Goal: Sit to Supine/Side - Progress: Progressing toward goal Pt will go Sit to Stand: with supervision PT Goal: Sit to Stand - Progress: Progressing toward goal Pt will go Stand to Sit: with supervision PT Goal: Stand to Sit - Progress: Progressing toward goal Pt will Ambulate: 51 - 150 feet;with rolling walker;with supervision PT Goal: Ambulate - Progress: Progressing toward goal Pt will Go Up / Down Stairs: 3-5 stairs;with min assist;with least restrictive assistive device  PT Treatment Precautions/Restrictions  Precautions Precautions: Posterior Hip Restrictions Weight Bearing Restrictions: No LUE Weight Bearing: Partial weight bearing LUE Partial Weight Bearing Percentage or Pounds: 25 - 50% Mobility (including Balance) Bed Mobility Supine to Sit: 4: Min assist Supine to Sit Details (indicate cue type and reason): cues for sequence/technique and THP Transfers Sit to Stand: 4: Min assist;With upper extremity assist;With armrests;From chair/3-in-1 Sit to Stand Details (indicate cue type and reason): cues for use of UEs and for LE managment to follow THP Stand to Sit: 4: Min assist;With upper extremity assist;To bed Stand to Sit Details: cues for use of UEs and for LE managment to follow THP Ambulation/Gait Ambulation/Gait Assistance: 4: Min assist Ambulation/Gait Assistance Details (indicate cue type and  reason): cues for sequence, posture and position from RW Ambulation Distance (Feet): 38 Feet Assistive device: Rolling walker Gait Pattern: Step-to pattern    Exercise    End of Session PT - End of Session Equipment Utilized During Treatment: Gait belt Activity Tolerance: Patient tolerated treatment well;Patient limited by fatigue Patient left: in bed;with call bell in reach Nurse Communication: Mobility status for transfers;Mobility status for ambulation General Behavior During Session: [facility] for tasks performed Cognition: [facility] for tasks performed  Shantay Sonn 03/25/2011, 2:39 PM

## 2011-03-25 NOTE — Progress Notes (Signed)
Subjective: 2 Days Post-Op Procedure(s) (LRB): TOTAL HIP ARTHROPLASTY (Left) Patient reports pain as mild.   Patient seen in rounds with Dr. Lequita Halt. Patient has complaints of more soreness today.  HGB down to 8.1.  Recommended blood today.  Continue therapy. Plan is still to go home.  Objective: Vital signs in last 24 hours: Temp:  [97.8 F (36.6 C)-99.7 F (37.6 C)] 99.7 F (37.6 C) (01/25 1445) Pulse Rate:  [68-101] 100  (01/25 1445) Resp:  [16-18] 18  (01/25 1445) BP: (94-127)/(59-75) 127/75 mmHg (01/25 1445) SpO2:  [100 %] 100 % (01/25 0530) Weight:  [67 kg (147 lb 11.3 oz)] 67 kg (147 lb 11.3 oz) (01/24 2350)  Intake/Output from previous day:  Intake/Output Summary (Last 24 hours) at 03/25/11 1456 Last data filed at 03/25/11 1451  Gross per 24 hour  Intake   2320 ml  Output   2350 ml  Net    -30 ml    Intake/Output this shift: Total I/O In: 1060 [P.O.:480; I.V.:250; Blood:330] Out: 1200 [Urine:1200]  Labs:  [facility] 03/25/11 0403 03/24/11 0350  HGB 8.1* 10.0*    Basename 03/25/11 0403 03/24/11 0350  WBC 5.8 8.8  RBC 2.52* 3.16*  HCT 24.1* 29.7*  PLT 176 221    Basename 03/25/11 0403 03/24/11 0350  NA 139 134*  K 3.6 4.1  CL 108 105  CO2 24 22  BUN 5* 7  CREATININE 0.57 0.56  GLUCOSE 110* 248*  CALCIUM 8.2* 8.4   No results found for this basename: LABPT:2,INR:2 in the last 72 hours  Exam - Neurovascular intact Sensation intact distally Dressing/Incision - clean, dry, no drainage Motor function intact - moving foot and toes well on exam.   Past Medical History  Diagnosis Date  . Blood transfusion 2002    with previous hip replacement   . Arthritis     pain and oa left hip-plans hip replacement  -- s/p right hip replacement -doing well.  pt has arthritis pain lower back, ankles, wrists and "everywhere"    Assessment/Plan: 2 Days Post-Op Procedure(s) (LRB): TOTAL HIP ARTHROPLASTY (Left) Principal Problem:  *Osteoarthritis of hip Active  Problems:  Postop Hyponatremia  Postop Acute blood loss anemia  Blood today and recheck labs tomorrow.   Up with therapy Plan for discharge tomorrow Discharge home with home health when met goals  DVT Prophylaxis - Xarelto Protocol Partial-Weight Bearing 25-50% left Leg  Rozalynn Buege 03/25/2011, 2:56 PM

## 2011-03-25 NOTE — Progress Notes (Signed)
Occupational Therapy Note Order noted. See patient is to receive 2 units of blood today. Note decreased BP. Will hold on eval and check back as schedule allows.  Judithann Sauger OTR/L [phone] 03/25/2011

## 2011-03-26 LAB — BASIC METABOLIC PANEL
CO2: 26 mEq/L (ref 19–32)
Chloride: 102 mEq/L (ref 96–112)
Creatinine, Ser: 0.55 mg/dL (ref 0.50–1.10)
GFR calc Af Amer: >90 mL/min (ref >90)
Potassium: 3.6 mEq/L (ref 3.5–5.1)

## 2011-03-26 LAB — TYPE AND SCREEN
Antibody Screen: NEGATIVE
Unit division: 0

## 2011-03-26 LAB — CBC
MCV: 91.8 fL (ref 78.0–100.0)
Platelets: 185 10*3/uL (ref 150–400)
RBC: 3.3 MIL/uL — ABNORMAL LOW (ref 3.87–5.11)
RDW: 15.3 % (ref 11.5–15.5)
WBC: 6.2 10*3/uL (ref 4.0–10.5)

## 2011-03-26 NOTE — Evaluation (Signed)
Occupational Therapy Evaluation Patient Details Name: Mindy Snyder MRN: [id number] DOB: 1950/04/17 Today's Date: 03/26/2011 950 [id number] ev1 Problem List:  Patient Active Problem List  Diagnoses  . Osteoarthritis of hip  . Postop Hyponatremia  . Postop Acute blood loss anemia    Past Medical History:  Past Medical History  Diagnosis Date  . Blood transfusion 2002    with previous hip replacement   . Arthritis     pain and oa left hip-plans hip replacement  -- s/p right hip replacement -doing well.  pt has arthritis pain lower back, ankles, wrists and "everywhere"   Past Surgical History:  Past Surgical History  Procedure Date  . Joint replacement 2002     right hip arthroplasty  . Breast surgery     benign cyst removed from breast age 59  . 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  She had other hip done 10 years ago and was seen to review precautions with ADLs and practice with AE.  She will stay with her parents.  Her daughter is an OT and can practice shower transfer as pt was too fatiqued to do this at time of OT eval.  Not further OT needs. OT Recommendation/Assessment: Patient does not need any further OT services OT Recommendation Follow Up Recommendations: No OT follow up Equipment Recommended: 3 in 1 bedside comode OT Goals    OT Evaluation Precautions/Restrictions  Precautions Precautions: Posterior Hip Restrictions Weight Bearing Restrictions: No LUE Weight Bearing: Partial weight bearing LUE Partial Weight Bearing Percentage or Pounds: 25 - 50% Prior Functioning Home Living Bathroom Shower/Tub: Walk-in shower Bathroom Toilet: Standard (with 3:1)   ADL ADL Grooming: Simulated;Set up Where Assessed - Grooming:  Supported;Sitting, chair Upper Body Bathing: Simulated;Set up Where Assessed - Upper Body Bathing: Sitting, chair;Supported Lower Body Bathing: Minimal assistance;Simulated (with adaptive equpment/ min guard) Where Assessed - Lower Body Bathing: Sit to stand from chair Upper Body Dressing: Performed;Set up Where Assessed - Upper Body Dressing: Sitting, chair;Supported Lower Body Dressing: Performed;Minimal assistance (with adaptive equipment) Where Assessed - Lower Body Dressing: Sit to stand from chair Toilet Transfer: Simulated;Minimal assistance (chair; min guard) Toileting - Clothing Manipulation: Simulated;Minimal assistance Where Assessed - Toileting Clothing Manipulation: Standing Toileting - Hygiene: Simulated;Minimal assistance (min guard) Where Assessed - Toileting Hygiene: Standing Equipment Used: Rolling walker;Reacher;Sock aid ADL Comments: reviewed precautions with ADLs.  Daughter is an OT.  Pt too tired to practice shower transfer but will have daughter come over:  verbalizes sequence Vision/Perception  Vision - History Patient Visual Report: No change from baseline Cognition Cognition Overall Cognitive Status: Appears within functional limits for tasks assessed Orientation Level: Oriented X4 Sensation/Coordination   Extremity Assessment RUE Assessment RUE Assessment: Within Functional Limits Mobility  Transfers Sit to Stand: From chair/3-in-1;4: Min assist;With upper extremity assist (min guard) Exercises   End of Session OT - End of Session Activity Tolerance: Patient limited by fatigue Patient left: in chair;with family/visitor present;with call bell in reach General Behavior During Session: [facility] for tasks performed [facility], OTR/L [phone] 03/26/2011  Dandria Griego 03/26/2011, 11:17 AM

## 2011-03-26 NOTE — Progress Notes (Signed)
Subjective: 3 Days Post-Op Procedure(s) (LRB): TOTAL HIP ARTHROPLASTY (Left) Patient reports pain as mild.   Patient has no complaints Feels much better today. Got up to bathroom this morning without difficulty  Objective: Vital signs in last 24 hours: Temp:  [98.7 F (37.1 C)-100.1 F (37.8 C)] 98.8 F (37.1 C) (01/26 0506) Pulse Rate:  [68-102] 93  (01/26 0506) Resp:  [16-18] 18  (01/26 0506) BP: (94-127)/(59-76) 109/73 mmHg (01/26 0506) SpO2:  [93 %-96 %] 93 % (01/26 0506)  Intake/Output from previous day:  Intake/Output Summary (Last 24 hours) at 03/26/11 0725 Last data filed at 03/26/11 0700  Gross per 24 hour  Intake 1565.66 ml  Output   2700 ml  Net -1134.34 ml    Intake/Output this shift:    Labs:  Basename 03/26/11 0505 03/25/11 0403 03/24/11 0350  HGB 10.5* 8.1* 10.0*    Basename 03/26/11 0505 03/25/11 0403  WBC 6.2 5.8  RBC 3.30* 2.52*  HCT 30.3* 24.1*  PLT 185 176    Basename 03/26/11 0505 03/25/11 0403  NA 136 139  K 3.6 3.6  CL 102 108  CO2 26 24  BUN 6 5*  CREATININE 0.55 0.57  GLUCOSE 132* 110*  CALCIUM 8.7 8.2*   No results found for this basename: LABPT:2,INR:2 in the last 72 hours  Exam - Neurologically intact Neurovascular intact Incision: dressing C/D/I No cellulitis present Dressing/Incision - clean, dry, no drainage Motor function intact - moving foot and toes well on exam.   Past Medical History  Diagnosis Date  . Blood transfusion 2002    with previous hip replacement   . Arthritis     pain and oa left hip-plans hip replacement  -- s/p right hip replacement -doing well.  pt has arthritis pain lower back, ankles, wrists and "everywhere"    Assessment/Plan: 3 Days Post-Op Procedure(s) (LRB): TOTAL HIP ARTHROPLASTY (Left) Principal Problem:  *Osteoarthritis of hip Active Problems:  Postop Hyponatremia  Postop Acute blood loss anemia   Up with therapy D/C IV fluids Discharge home with home health  DVT Prophylaxis  - Xarelto Protocol Partial-Weight Bearing 25-50% left Leg  Viera Okonski V 03/26/2011, 7:25 AM

## 2011-03-26 NOTE — Progress Notes (Signed)
Discharged from floor via w/c, family with pt. No changes in assessment. Mindy Snyder  

## 2011-03-26 NOTE — Progress Notes (Signed)
[name] spoke with pt concerning d/c planning. [facility] to provide HHPT. [name] DME in room prior to discharge. Pt states no other HH services or equipment needed. [name] at bedside to assist in care.  Mindy Snyder [phone]

## 2011-03-26 NOTE — Progress Notes (Signed)
Physical Therapy Treatment Patient Details Name: Mindy Snyder MRN: [id number] DOB: Jun 27, 1950 Today's Date: 03/26/2011  PT Assessment/Plan  PT - Assessment/Plan Comments on Treatment Session: Reviewed car transfers with pt and daughter PT Plan: Discharge plan remains appropriate PT Frequency: 7X/week Recommendations for Other Services: OT consult Follow Up Recommendations: Home health PT Equipment Recommended: 3 in 1 bedside comode PT Goals  Acute Rehab PT Goals PT Goal Formulation: With patient Time For Goal Achievement: 7 days Pt will go Supine/Side to Sit: with supervision PT Goal: Supine/Side to Sit - Progress: Met Pt will go Sit to Supine/Side: with supervision Pt will go Sit to Stand: with supervision PT Goal: Sit to Stand - Progress: Met Pt will go Stand to Sit: with supervision PT Goal: Stand to Sit - Progress: Met Pt will Ambulate: 51 - 150 feet;with rolling walker;with supervision PT Goal: Ambulate - Progress: Met Pt will Go Up / Down Stairs: 3-5 stairs;with min assist;with least restrictive assistive device PT Goal: Up/Down Stairs - Progress: Met  PT Treatment Precautions/Restrictions  Precautions Precautions: Posterior Hip Restrictions Weight Bearing Restrictions: Yes LUE Weight Bearing: Partial weight bearing LUE Partial Weight Bearing Percentage or Pounds: 25 - 50% Mobility (including Balance) Bed Mobility Supine to Sit: 5: Supervision Supine to Sit Details (indicate cue type and reason): cues for sequence Transfers Sit to Stand: 5: Supervision;With armrests;From chair/3-in-1;With upper extremity assist Sit to Stand Details (indicate cue type and reason): cues for use of UEs Stand to Sit: To chair/3-in-1;With armrests;With upper extremity assist;5: Supervision Stand to Sit Details: cues for LE position  Ambulation/Gait Ambulation/Gait Assistance: 5: Supervision Ambulation/Gait Assistance Details (indicate cue type and reason): cues for position from  RW Ambulation Distance (Feet): 50 Feet Assistive device: Rolling walker Gait Pattern: Step-to pattern Stairs: Yes Stairs Assistance: 4: Min assist Stairs Assistance Details (indicate cue type and reason): cues for sequence and foot/crutch placement Stair Management Technique: One rail Right;Forwards;Step to pattern;With crutches (daughter assisting on descent) Number of Stairs: 4      End of Session PT - End of Session Equipment Utilized During Treatment: Gait belt Activity Tolerance: Patient tolerated treatment well Patient left: in chair;with call bell in reach;with family/visitor present Nurse Communication: Mobility status for transfers;Mobility status for ambulation General Behavior During Session: Mindy Snyder for tasks performed Cognition: Mindy Snyder for tasks performed  Mindy Snyder 03/26/2011, 1:15 PM

## 2011-03-26 NOTE — Progress Notes (Signed)
Physical Therapy Treatment Patient Details Name: Hansini Clodfelter MRN: [id number] DOB: May 31, 1950 Today's Date: 03/26/2011  PT Assessment/Plan  PT - Assessment/Plan PT Plan: Discharge plan remains appropriate PT Frequency: 7X/week Recommendations for Other Services: OT consult Follow Up Recommendations: Home health PT Equipment Recommended: 3 in 1 bedside comode PT Goals  Acute Rehab PT Goals PT Goal Formulation: With patient Time For Goal Achievement: 7 days Pt will go Supine/Side to Sit: with supervision PT Goal: Supine/Side to Sit - Progress: Met Pt will go Sit to Supine/Side: with supervision Pt will go Sit to Stand: with supervision PT Goal: Sit to Stand - Progress: Met Pt will go Stand to Sit: with supervision PT Goal: Stand to Sit - Progress: Met Pt will Ambulate: 51 - 150 feet;with rolling walker;with supervision PT Goal: Ambulate - Progress: Met Pt will Go Up / Down Stairs: 3-5 stairs;with min assist;with least restrictive assistive device  PT Treatment Precautions/Restrictions  Precautions Precautions: Posterior Hip Restrictions Weight Bearing Restrictions: Yes LUE Weight Bearing: Partial weight bearing LUE Partial Weight Bearing Percentage or Pounds: 25 - 50% Mobility (including Balance) Bed Mobility Supine to Sit: 5: Supervision Supine to Sit Details (indicate cue type and reason): cues for sequence Transfers Sit to Stand: From chair/3-in-1;4: Min assist;With upper extremity assist (min guard) Sit to Stand Details (indicate cue type and reason): cues for use of UEs and LE position Stand to Sit: With upper extremity assist;With armrests;To chair/3-in-1;5: Supervision Stand to Sit Details: cues for use of UEs and LE position Ambulation/Gait Ambulation/Gait Assistance: 5: Supervision;4: Min assist Ambulation/Gait Assistance Details (indicate cue type and reason): cues for posture, ER on L and stride length Ambulation Distance (Feet): 120 Feet Assistive  device: Rolling walker Gait Pattern: Step-to pattern    Exercise  Total Joint Exercises Ankle Circles/Pumps: AROM;20 reps;Both;Supine Quad Sets: AROM;10 reps;Supine;Both Gluteal Sets: AROM;10 reps;Supine;Both Short Arc Quad: 5 reps;10 reps;Supine;Both;AROM;AAROM Heel Slides: AAROM;20 reps;Supine;Left Hip ABduction/ADduction: AAROM;20 reps;Left;Supine End of Session PT - End of Session Activity Tolerance: Patient tolerated treatment well Patient left: in chair;with call bell in reach;with family/visitor present Nurse Communication: Mobility status for transfers;Mobility status for ambulation General Behavior During Session: [facility] for tasks performed Cognition: [facility] for tasks performed  Emilyrose Darrah 03/26/2011, 1:10 PM

## 2011-04-06 DIAGNOSIS — Z9289 Personal history of other medical treatment: Secondary | ICD-10-CM

## 2011-04-06 NOTE — Discharge Summary (Signed)
Physician Discharge Summary   Patient ID: Mindy Snyder MRN: [id number] DOB/AGE: [birth year] 61 y.o.  Admit date: 03/23/2011 Discharge date: 03/26/2011  Primary Diagnosis: Osteoarthritis Left Hip  Admission Diagnoses: Past Medical History  Diagnosis Date  . Blood transfusion 2002    with previous hip replacement   . Arthritis     pain and oa left hip-plans hip replacement  -- s/p right hip replacement -doing well.  pt has arthritis pain lower back, ankles, wrists and "everywhere"   Discharge Diagnoses:  Principal Problem:  *Osteoarthritis of hip Active Problems:  Postop Hyponatremia  Postop Acute blood loss anemia  Postop Transfusion history  Procedure: Procedure(s) (LRB): TOTAL HIP ARTHROPLASTY (Left)   Consults: None  HPI: Mindy Snyder is a 61 y.o. female with end stage arthritis of her left hip with progressively worsening pain and dysfunction. Pain occurs with activity and rest including pain at night. She has tried analgesics, protected weight bearing and rest without benefit. Pain is too severe to attempt physical therapy. Radiographs demonstrate bone on bone arthritis with subchondral cyst formation. She presents now for left THA.  Laboratory Data: Hospital Outpatient Visit on 03/16/2011  Component Date Value Range Status  . MRSA, PCR  03/16/2011 NEGATIVE  NEGATIVE Final  . Staphylococcus aureus  03/16/2011 NEGATIVE  NEGATIVE Final   Comment:                                 The Xpert SA Assay (FDA                          approved for NASAL specimens                          only), is one component of                          a comprehensive surveillance                          program.  It is not intended                          to diagnose infection nor to                          guide or monitor treatment.  Marland Kitchen aPTT (seconds) 03/16/2011 30  24-37 Final  . WBC (K/uL) 03/16/2011 5.0  4.0-10.5 Final  . RBC (MIL/uL) 03/16/2011 4.41  3.87-5.11 Final  .  Hemoglobin (g/dL) 82/95/6213 08.6  57.8-46.9 Final  . HCT (%) 03/16/2011 41.7  36.0-46.0 Final  . MCV (fL) 03/16/2011 94.6  78.0-100.0 Final  . MCH (pg) 03/16/2011 31.5  26.0-34.0 Final  . MCHC (g/dL) 62/95/2841 32.4  40.1-02.7 Final  . RDW (%) 03/16/2011 12.9  11.5-15.5 Final  . Platelets (K/uL) 03/16/2011 244  150-400 Final  . Sodium (mEq/L) 03/16/2011 140  135-145 Final  . Potassium (mEq/L) 03/16/2011 4.8  3.5-5.1 Final  . Chloride (mEq/L) 03/16/2011 105  96-112 Final  . CO2 (mEq/L) 03/16/2011 26  19-32 Final  . Glucose, Bld (mg/dL) 25/36/6440 347* 42-59 Final  . BUN (mg/dL) 56/38/7564 16  3-32 Final  . Creatinine, Ser (mg/dL) 95/18/8416 6.06  3.01-6.01 Final  .  Calcium (mg/dL) 11/91/4782 9.9  9.5-62.1 Final  . Total Protein (g/dL) 30/86/5784 7.1  6.9-6.2 Final  . Albumin (g/dL) 95/28/4132 3.8  4.4-0.1 Final  . AST (U/L) 03/16/2011 14  0-37 Final  . ALT (U/L) 03/16/2011 14  0-35 Final  . Alkaline Phosphatase (U/L) 03/16/2011 82  39-117 Final  . Total Bilirubin (mg/dL) 02/72/5366 0.3  4.4-0.3 Final  . GFR calc non Af Amer (mL/min) 03/16/2011 >90  >90 Final  . GFR calc Af Amer (mL/min) 03/16/2011 >90  >90 Final   Comment:                                 The eGFR has been calculated                          using the CKD EPI equation.                          This calculation has not been                          validated in all clinical                          situations.                          eGFR's persistently                          <90 mL/min signify                          possible Chronic Kidney Disease.  Marland Kitchen Prothrombin Time (seconds) 03/16/2011 12.6  11.6-15.2 Final  . INR  03/16/2011 0.92  0.00-1.49 Final  . Color, Urine  03/16/2011 YELLOW  YELLOW Final  . APPearance  03/16/2011 CLEAR  CLEAR Final  . Specific Gravity, Urine  03/16/2011 1.014  1.005-1.030 Final  . pH  03/16/2011 7.5  5.0-8.0 Final  . Glucose, UA (mg/dL) 47/42/5956 NEGATIVE  NEGATIVE Final  . Hgb  urine dipstick  03/16/2011 NEGATIVE  NEGATIVE Final  . Bilirubin Urine  03/16/2011 NEGATIVE  NEGATIVE Final  . Ketones, ur (mg/dL) 38/75/6433 NEGATIVE  NEGATIVE Final  . Protein, ur (mg/dL) 29/51/8841 NEGATIVE  NEGATIVE Final  . Urobilinogen, UA (mg/dL) 66/07/3014 1.0  0.1-0.9 Final  . Nitrite  03/16/2011 NEGATIVE  NEGATIVE Final  . Leukocytes, UA  03/16/2011 NEGATIVE  NEGATIVE Final   MICROSCOPIC NOT DONE ON URINES WITH NEGATIVE PROTEIN, BLOOD, LEUKOCYTES, NITRITE, OR GLUCOSE <1000 mg/dL.   No results found for this basename: HGB:5 in the last 72 hours No results found for this basename: WBC:2,RBC:2,HCT:2,PLT:2 in the last 72 hours No results found for this basename: NA:2,K:2,CL:2,CO2:2,BUN:2,CREATININE:2,GLUCOSE:2,CALCIUM:2 in the last 72 hours No results found for this basename: LABPT:2,INR:2 in the last 72 hours  X-Rays:X-ray Hip Left Ap And Lateral  03/16/2011  *RADIOLOGY REPORT*  Clinical Data: Patient for hip replacement.  Preoperative films.  LEFT HIP - COMPLETE 2+ VIEW  Comparison: None.  Findings: There is bone-on-bone joint space narrowing of the left hip with subchondral cyst formation and sclerosis.  Flattening of the left femoral head could be due to avascular necrosis.  No acute  bony or joint abnormality is identified.  IMPRESSION: Advanced left hip osteoarthritis.  Original Report Authenticated By: Bernadene Bell. Maricela Curet, M.D.   Dg Pelvis Portable  03/23/2011  *RADIOLOGY REPORT*  Clinical Data: Status post left hip replacement.  PORTABLE PELVIS  Comparison: Plain films left hip 03/16/2011.  Findings: The patient has a new left total hip arthroplasty.  The device is located and no fracture is seen.  Gas in the soft tissues and surgical drain are noted.  Right total hip replacement is also noted. The acetabular cup of the right hip appears to have an atypically increased vertical orientation.  IMPRESSION:  1.  New left total hip without evidence of complication. 2.  Old right total hip.   Acetabular cup has an atypically increased vertical orientation.  Original Report Authenticated By: Bernadene Bell. D'ALESSIO, M.D.   Dg Hip Portable 1 View Left  03/23/2011  *RADIOLOGY REPORT*  Clinical Data: Left hip replacement.  PORTABLE LEFT HIP - 1 VIEW  Comparison: Plain films 03/16/2011.  Findings: The patient has a new left total hip replacement.  Gas in the soft tissues and surgical drain noted.  The device is located and there is no fracture.  IMPRESSION: Left total hip without evidence of complication.  Original Report Authenticated By: Bernadene Bell. D'ALESSIO, M.D.    EKG:No orders found for this or any previous visit.   Hospital Course: Patient was admitted to [facility] and taken to the OR and underwent the above state procedure without complications.  Patient tolerated the procedure well and was later transferred to the recovery room and then to the orthopaedic floor for postoperative care.  They were given PO and IV analgesics for pain control following their surgery.  They were given 24 hours of postoperative antibiotics and started on DVT prophylaxis.   PT and OT were ordered for total joint protocol.  Discharge planning consulted to help with postop disposition and equipment needs.  Patient had a good night on the evening of surgery and started to get up with therapy on day one.Hemovac drain was pulled without difficulty.  Continued to progress with therapy into day two walking over 60 feet.  Dressing was changed on day two and the incision was healing well. HGB was low though on day 2 and the patient received blood. By day three, the patient's HGB was back up and she  had progressed with therapy and meeting goals.  Incision was healing well.  Patient was seen in rounds and was ready to go home.  Discharge Medications: Prior to Admission medications   Medication Sig Start Date End Date Taking? Authorizing Provider  rivaroxaban (XARELTO) 10 MG TABS tablet Take 1 tablet (10 mg total)  by mouth daily with breakfast. 03/25/11   Taquanna Borras Julien Girt, PA    Diet: regular  Activity:PWB No bending hip over 90 degrees- A "L" Angle Do not cross legs Do not let foot roll inward  When turning these patients a pillow should be placed between the patient's legs to prevent crossing.  Patients should have the affected knee fully extended when trying to sit or stand from all surfaces to prevent excessive hip flexion.  When ambulating and turning toward the affected side the affected leg should have the toes turned out prior to moving the walker and the rest of patient's body as to prevent internal rotation/ turning in of the leg.  Abduction pillows are the most effective way to prevent a patient from not crossing legs or turning toes in  at rest. If an abduction pillow is not ordered placing a regular pillow length wise between the patient's legs is also an effective reminder.  It is imperative that these precautions be maintained so that the surgical hip does not dislocate.    Follow-up:in 2 weeks after surgery  Disposition: Home  Discharged Condition: good   Discharge Orders    Future Orders Please Complete By Expires   Diet - low sodium heart healthy      Call MD / Call 911      Comments:   If you experience chest pain or shortness of breath, CALL 911 and be transported to the hospital emergency room.  If you develope a fever above 101 F, pus (white drainage) or increased drainage or redness at the wound, or calf pain, call your surgeon's office.   Constipation Prevention      Comments:   Drink plenty of fluids.  Prune juice may be helpful.  You may use a stool softener, such as Colace (over the counter) 100 mg twice a day.  Use MiraLax (over the counter) for constipation as needed.   Increase activity slowly as tolerated      Weight Bearing as taught in Physical Therapy      Comments:   Use a walker or crutches as instructed.   Discharge instructions      Comments:    Pick up stool softner and laxative for home. Do not submerge incision under water. May shower. Continue to use ice for pain and swelling from surgery. Hip precautions.  Total Hip Protocol.   Driving restrictions      Comments:   No driving   Lifting restrictions      Comments:   No lifting   Follow the hip precautions as taught in Physical Therapy      Change dressing      Comments:   You may change your dressing daily with sterile 4 x 4 inch gauze dressing and paper tape.   TED hose      Comments:   Use stockings (TED hose) for 3 weeks on both leg(s).  You may remove them at night for sleeping.     Medication List  As of 04/06/2011  8:05 AM   STOP taking these medications         acetaminophen 500 MG tablet      ibuprofen 200 MG tablet         TAKE these medications         rivaroxaban 10 MG Tabs tablet   Commonly known as: XARELTO   Take 1 tablet (10 mg total) by mouth daily with breakfast.            Signed: Yavonne Kiss 04/06/2011, 8:05 AM

## 2016-01-29 DIAGNOSIS — Z8719 Personal history of other diseases of the digestive system: Secondary | ICD-10-CM

## 2016-01-29 HISTORY — DX: Personal history of other diseases of the digestive system: Z87.19

## 2016-02-02 ENCOUNTER — Emergency Department (HOSPITAL_COMMUNITY): Payer: Medicare Other

## 2016-02-02 ENCOUNTER — Inpatient Hospital Stay (HOSPITAL_COMMUNITY)
Admission: EM | Admit: 2016-02-02 | Discharge: 2016-02-05 | DRG: 392 | Disposition: A | Discharge status: Home / Self Care | Payer: Medicare Other | Attending: Internal Medicine | Admitting: Internal Medicine

## 2016-02-02 ENCOUNTER — Encounter (HOSPITAL_COMMUNITY): Payer: Self-pay | Admitting: Emergency Medicine

## 2016-02-02 DIAGNOSIS — Z885 Allergy status to narcotic agent status: Secondary | ICD-10-CM

## 2016-02-02 DIAGNOSIS — I10 Essential (primary) hypertension: Secondary | ICD-10-CM | POA: Diagnosis present

## 2016-02-02 DIAGNOSIS — E876 Hypokalemia: Secondary | ICD-10-CM | POA: Diagnosis present

## 2016-02-02 DIAGNOSIS — E860 Dehydration: Secondary | ICD-10-CM | POA: Diagnosis present

## 2016-02-02 DIAGNOSIS — D72829 Elevated white blood cell count, unspecified: Secondary | ICD-10-CM | POA: Diagnosis not present

## 2016-02-02 DIAGNOSIS — Z96641 Presence of right artificial hip joint: Secondary | ICD-10-CM | POA: Diagnosis present

## 2016-02-02 DIAGNOSIS — M1990 Unspecified osteoarthritis, unspecified site: Secondary | ICD-10-CM | POA: Diagnosis present

## 2016-02-02 DIAGNOSIS — K5792 Diverticulitis of intestine, part unspecified, without perforation or abscess without bleeding: Secondary | ICD-10-CM | POA: Insufficient documentation

## 2016-02-02 DIAGNOSIS — Z87891 Personal history of nicotine dependence: Secondary | ICD-10-CM

## 2016-02-02 DIAGNOSIS — E039 Hypothyroidism, unspecified: Secondary | ICD-10-CM | POA: Diagnosis present

## 2016-02-02 DIAGNOSIS — K5720 Diverticulitis of large intestine with perforation and abscess without bleeding: Principal | ICD-10-CM | POA: Diagnosis present

## 2016-02-02 DIAGNOSIS — R1032 Left lower quadrant pain: Secondary | ICD-10-CM | POA: Diagnosis not present

## 2016-02-02 DIAGNOSIS — Z79899 Other long term (current) drug therapy: Secondary | ICD-10-CM

## 2016-02-02 LAB — COMPREHENSIVE METABOLIC PANEL
ALK PHOS: 86 U/L (ref 38–126)
ALT: 13 U/L — AB (ref 14–54)
AST: 15 U/L (ref 15–41)
Albumin: 4.4 g/dL (ref 3.5–5.0)
Anion gap: 10 (ref 5–15)
BILIRUBIN TOTAL: 0.8 mg/dL (ref 0.3–1.2)
BUN: 14 mg/dL (ref 6–20)
CALCIUM: 9.4 mg/dL (ref 8.9–10.3)
CHLORIDE: 100 mmol/L — AB (ref 101–111)
CO2: 26 mmol/L (ref 22–32)
CREATININE: 0.58 mg/dL (ref 0.44–1.00)
Glucose, Bld: 105 mg/dL — ABNORMAL HIGH (ref 65–99)
Potassium: 3.4 mmol/L — ABNORMAL LOW (ref 3.5–5.1)
Sodium: 136 mmol/L (ref 135–145)
TOTAL PROTEIN: 7.5 g/dL (ref 6.5–8.1)

## 2016-02-02 LAB — LIPASE, BLOOD: LIPASE: 24 U/L (ref 11–51)

## 2016-02-02 LAB — URINALYSIS, ROUTINE W REFLEX MICROSCOPIC
Bacteria, UA: NONE SEEN
Bilirubin Urine: NEGATIVE
GLUCOSE, UA: NEGATIVE mg/dL
HGB URINE DIPSTICK: NEGATIVE
KETONES UR: NEGATIVE mg/dL
NITRITE: NEGATIVE
PH: 6 (ref 5.0–8.0)
PROTEIN: NEGATIVE mg/dL
Specific Gravity, Urine: >1.046 — ABNORMAL HIGH (ref 1.005–1.030)

## 2016-02-02 LAB — CBC
HCT: 41.4 % (ref 36.0–46.0)
Hemoglobin: 13.8 g/dL (ref 12.0–15.0)
MCH: 32 pg (ref 26.0–34.0)
MCHC: 33.3 g/dL (ref 30.0–36.0)
MCV: 96.1 fL (ref 78.0–100.0)
PLATELETS: 245 10*3/uL (ref 150–400)
RBC: 4.31 MIL/uL (ref 3.87–5.11)
RDW: 13.5 % (ref 11.5–15.5)
WBC: 12.6 10*3/uL — AB (ref 4.0–10.5)

## 2016-02-02 MED ORDER — HYDROMORPHONE HCL 1 MG/ML IJ SOLN
1.0000 mg | Freq: Once | INTRAMUSCULAR | Status: AC
  Administered 2016-02-02: 1 mg via INTRAVENOUS
  Filled 2016-02-02: qty 1

## 2016-02-02 MED ORDER — IOPAMIDOL (ISOVUE-300) INJECTION 61%
100.0000 mL | Freq: Once | INTRAVENOUS | Status: AC | PRN
  Administered 2016-02-02: 100 mL via INTRAVENOUS

## 2016-02-02 MED ORDER — ACETAMINOPHEN 325 MG PO TABS
650.0000 mg | ORAL_TABLET | Freq: Four times a day (QID) | ORAL | Status: DC | PRN
  Administered 2016-02-03: 650 mg via ORAL
  Filled 2016-02-02: qty 2

## 2016-02-02 MED ORDER — METRONIDAZOLE IN NACL 5-0.79 MG/ML-% IV SOLN
500.0000 mg | Freq: Once | INTRAVENOUS | Status: AC
  Administered 2016-02-02: 500 mg via INTRAVENOUS
  Filled 2016-02-02: qty 100

## 2016-02-02 MED ORDER — SODIUM CHLORIDE 0.9 % IJ SOLN
INTRAMUSCULAR | Status: AC
  Filled 2016-02-02: qty 50

## 2016-02-02 MED ORDER — ONDANSETRON HCL 4 MG PO TABS: 4.0000 mg | ORAL_TABLET | Freq: Four times a day (QID) | ORAL | Status: DC | PRN

## 2016-02-02 MED ORDER — HYDROCHLOROTHIAZIDE 25 MG PO TABS: 25.0000 mg | ORAL_TABLET | Freq: Every day | ORAL | Status: DC

## 2016-02-02 MED ORDER — ONDANSETRON HCL 4 MG/2ML IJ SOLN
4.0000 mg | Freq: Four times a day (QID) | INTRAMUSCULAR | Status: DC | PRN
  Administered 2016-02-03: 4 mg via INTRAVENOUS
  Filled 2016-02-02: qty 2

## 2016-02-02 MED ORDER — HYDROMORPHONE HCL 1 MG/ML IJ SOLN: 1.0000 mg | INTRAMUSCULAR | Status: DC | PRN

## 2016-02-02 MED ORDER — SODIUM CHLORIDE 0.9 % IV SOLN
INTRAVENOUS | Status: DC
  Administered 2016-02-03 – 2016-02-04 (×2): via INTRAVENOUS

## 2016-02-02 MED ORDER — ACETAMINOPHEN 650 MG RE SUPP: 650.0000 mg | Freq: Four times a day (QID) | RECTAL | Status: DC | PRN

## 2016-02-02 MED ORDER — HYDROCODONE-ACETAMINOPHEN 5-325 MG PO TABS: 1.0000 | ORAL_TABLET | ORAL | Status: DC | PRN

## 2016-02-02 MED ORDER — ENOXAPARIN SODIUM 40 MG/0.4ML ~~LOC~~ SOLN
40.0000 mg | SUBCUTANEOUS | Status: DC
  Administered 2016-02-03 – 2016-02-04 (×2): 40 mg via SUBCUTANEOUS
  Filled 2016-02-02 (×3): qty 0.4

## 2016-02-02 MED ORDER — LISINOPRIL 20 MG PO TABS
20.0000 mg | ORAL_TABLET | Freq: Every day | ORAL | Status: DC
  Administered 2016-02-03 – 2016-02-05 (×3): 20 mg via ORAL
  Filled 2016-02-02 (×3): qty 1

## 2016-02-02 MED ORDER — METRONIDAZOLE IN NACL 5-0.79 MG/ML-% IV SOLN
500.0000 mg | Freq: Three times a day (TID) | INTRAVENOUS | Status: DC
  Administered 2016-02-03 – 2016-02-05 (×6): 500 mg via INTRAVENOUS
  Filled 2016-02-02 (×6): qty 100

## 2016-02-02 MED ORDER — CIPROFLOXACIN IN D5W 400 MG/200ML IV SOLN
400.0000 mg | Freq: Once | INTRAVENOUS | Status: AC
  Administered 2016-02-03: 400 mg via INTRAVENOUS
  Filled 2016-02-02: qty 200

## 2016-02-02 MED ORDER — POTASSIUM CHLORIDE CRYS ER 20 MEQ PO TBCR
20.0000 meq | EXTENDED_RELEASE_TABLET | ORAL | Status: AC
  Administered 2016-02-03: 20 meq via ORAL
  Filled 2016-02-02: qty 1

## 2016-02-02 MED ORDER — IOPAMIDOL (ISOVUE-300) INJECTION 61%
INTRAVENOUS | Status: AC
  Filled 2016-02-02: qty 100

## 2016-02-02 MED ORDER — HYDROMORPHONE HCL 1 MG/ML IJ SOLN
0.5000 mg | Freq: Once | INTRAMUSCULAR | Status: AC
  Administered 2016-02-02: 0.5 mg via INTRAVENOUS
  Filled 2016-02-02: qty 1

## 2016-02-02 MED ORDER — ONDANSETRON HCL 4 MG/2ML IJ SOLN
4.0000 mg | Freq: Once | INTRAMUSCULAR | Status: AC
  Administered 2016-02-02: 4 mg via INTRAVENOUS
  Filled 2016-02-02: qty 2

## 2016-02-02 NOTE — ED Triage Notes (Signed)
Patient has had lower abdominal pain starting 6 weeks ago, becoming more severe this morning. Denies n/v/d. Patient was seen [facility] and had blood work, urinalysis, and xray of abdomen. Patient was sent here. Patient has results at the bedside. Patient's x-ray impression states "could reflect a focal ileus." Patient's last BM was this morning.

## 2016-02-02 NOTE — H&P (Signed)
History and Physical    Mindy Snyder ZOX:[id number] DOB: 1950-06-09 DOA: 02/02/2016  Referring MD/NP/PA: Roxy Horsemanobert Browning, PA-C PCP: PROVIDER NOT IN SYSTEM  Patient coming from: Urgent care  Chief Complaint: Lower abdominal pain  HPI: Mindy Snyder is a 65 y.o. female with medical history significant of HTN and arthritis; who presents with complaints of abdominal pain since yesterday evening. Patient notes acute onset of a dull pain across the left lower abdomen. She tried taking an antacid without improvement of symptoms. Pain seemed somewhat worse with movement. She initially got symptoms may to self resolve on their own. She had had similar symptoms that self resolve 6 weeks ago that felt more so like pressure. However, symptoms persisted and seem to be worsening and she noted pain radiating upwards. She was unable to get a PCP office and therefore went to urgent care and was told to come to the emergency department for further evaluation. Patient notes that she's been able to eat and drink like normal. Associated symptoms include some chills. Denies any blood in stool/urinating, dysuria, vomiting, fever, headache, or recent sick contacts. Only recent change is that the patient was started on medication 6 weeks ago for essential hypertension.  ED Course: Upon admission to the emergency department patient was seen to have almost relatively within normal limits. Lab work revealed WBC 12.6, potassium 3.4, and all other lab work relatively within normal limits. CT imaging of the abdomen and pelvis revealed acute sigmoid diverticulitis with signs of perforation without abscess. Patient was started on antibiotics of ciprofloxacin, Flagyl, and given IV Dilaudid for pain.  Review of Systems: As per HPI otherwise 10 point review of systems negative.   Past Medical History:  Diagnosis Date  . Arthritis    pain and oa left hip-plans hip replacement  -- s/p right hip replacement -doing well.  pt  has arthritis pain lower back, ankles, wrists and "everywhere"  . Blood transfusion 2002   with previous hip replacement     Past Surgical History:  Procedure Laterality Date  . BREAST SURGERY     benign cyst removed from breast age 65  . JOINT REPLACEMENT  2002    right hip arthroplasty  . TOTAL HIP ARTHROPLASTY  03/23/2011   Procedure: TOTAL HIP ARTHROPLASTY;  Surgeon: Loanne DrillingFrank V Aluisio, MD;  Location: [facility];  Service: Orthopedics;  Laterality: Left;     reports that she has quit smoking. Her smoking use included Cigarettes. She has a 25.00 pack-year smoking history. She has never used smokeless tobacco. She reports that she drinks alcohol. She reports that she does not use drugs.  Allergies  Allergen Reactions  . Codeine Other (See Comments) and Nausea Only    Upset Stomach    No family history on file.  Prior to Admission medications   Medication Sig Start Date End Date Taking? Authorizing Provider  amoxicillin (AMOXIL) 500 MG capsule Take 2,000 mg by mouth as needed (prior to dental procedures.).   Yes Historical Provider, MD  hydrochlorothiazide (HYDRODIURIL) 25 MG tablet Take 25 mg by mouth daily. 12/10/15  Yes Historical Provider, MD  lisinopril (PRINIVIL,ZESTRIL) 20 MG tablet Take 20 mg by mouth daily. 01/07/16  Yes Historical Provider, MD  rivaroxaban (XARELTO) 10 MG TABS tablet Take 1 tablet (10 mg total) by mouth daily with breakfast. Patient not taking: Reported on 02/02/2016 03/25/11   Alexzandrew Tessie FassL Perkins, PA-C    Physical Exam:    Constitutional: Older female who appears to be in  some discomfort especially when changing positions, but able to follow commands. Vitals:   02/02/16 1843 02/02/16 1846 02/02/16 2104  BP: 124/70  127/72  Pulse: 92  87  Resp: 18  16  Temp: 98.4 F (36.9 C)  98.5 F (36.9 C)  TempSrc: Oral  Oral  SpO2: 100%  94%  Weight:  69.4 kg (153 lb)   Height:  5\' 5"  (1.651 m)    Eyes: PERRL, lids and conjunctivae normal ENMT: Mucous  membranes are moist. Posterior pharynx clear of any exudate or lesions.Normal dentition.  Neck: normal, supple, no masses, no thyromegaly Respiratory: clear to auscultation bilaterally, no wheezing, no crackles. Normal respiratory effort. No accessory muscle use.  Cardiovascular: Regular rate and rhythm, no murmurs / rubs / gallops. No extremity edema. 2+ pedal pulses. No carotid bruits.  Abdomen: Tenderness to palpation of the left lower abdominal quadrant, no masses palpated. No hepatosplenomegaly. Bowel sounds positive.  Musculoskeletal: no clubbing / cyanosis. No joint deformity upper and lower extremities. Good ROM, no contractures. Normal muscle tone.  Skin: no rashes, lesions, ulcers. No induration Neurologic: CN 2-12 grossly intact. Sensation intact, DTR normal. Strength 5/5 in all 4.  Psychiatric: Normal judgment and insight. Alert and oriented x 3. Normal mood.     Labs on Admission: I have personally reviewed following labs and imaging studies  CBC:  Recent Labs Lab 02/02/16 1905  WBC 12.6*  HGB 13.8  HCT 41.4  MCV 96.1  PLT 245   Basic Metabolic Panel:  Recent Labs Lab 02/02/16 1905  NA 136  K 3.4*  CL 100*  CO2 26  GLUCOSE 105*  BUN 14  CREATININE 0.58  CALCIUM 9.4   GFR: Estimated Creatinine Clearance: 66.8 mL/min (by C-G formula based on SCr of 0.58 mg/dL). Liver Function Tests:  Recent Labs Lab 02/02/16 1905  AST 15  ALT 13*  ALKPHOS 86  BILITOT 0.8  PROT 7.5  ALBUMIN 4.4    Recent Labs Lab 02/02/16 1905  LIPASE 24   No results for input(s): AMMONIA in the last 168 hours. Coagulation Profile: No results for input(s): INR, PROTIME in the last 168 hours. Cardiac Enzymes: No results for input(s): CKTOTAL, CKMB, CKMBINDEX, TROPONINI in the last 168 hours. BNP (last 3 results) No results for input(s): PROBNP in the last 8760 hours. HbA1C: No results for input(s): HGBA1C in the last 72 hours. CBG: No results for input(s): GLUCAP in the  last 168 hours. Lipid Profile: No results for input(s): CHOL, HDL, LDLCALC, TRIG, CHOLHDL, LDLDIRECT in the last 72 hours. Thyroid Function Tests: No results for input(s): TSH, T4TOTAL, FREET4, T3FREE, THYROIDAB in the last 72 hours. Anemia Panel: No results for input(s): VITAMINB12, FOLATE, FERRITIN, TIBC, IRON, RETICCTPCT in the last 72 hours. Urine analysis:    Component Value Date/Time   COLORURINE YELLOW 02/02/2016 1850   APPEARANCEUR CLEAR 02/02/2016 1850   LABSPEC >1.046 (H) 02/02/2016 1850   PHURINE 6.0 02/02/2016 1850   GLUCOSEU NEGATIVE 02/02/2016 1850   HGBUR NEGATIVE 02/02/2016 1850   BILIRUBINUR NEGATIVE 02/02/2016 1850   KETONESUR NEGATIVE 02/02/2016 1850   PROTEINUR NEGATIVE 02/02/2016 1850   UROBILINOGEN 1.0 03/16/2011 0917   NITRITE NEGATIVE 02/02/2016 1850   LEUKOCYTESUR TRACE (A) 02/02/2016 1850   Sepsis Labs: No results found for this or any previous visit (from the past 240 hour(s)).   Radiological Exams on Admission: Ct Abdomen Pelvis W Contrast  Result Date: 02/02/2016 CLINICAL DATA:  Initial evaluation for severe left lower quadrant abdominal pain. EXAM: CT  ABDOMEN AND PELVIS WITH CONTRAST TECHNIQUE: Multidetector CT imaging of the abdomen and pelvis was performed using the standard protocol following bolus administration of intravenous contrast. CONTRAST:  100mL ISOVUE-300 IOPAMIDOL (ISOVUE-300) INJECTION 61% COMPARISON:  None available. FINDINGS: Lower chest: Mild atelectatic changes present within the visualized lung bases. The fat containing Bochdalek's type hernia present at the right lung base. Visualized lungs are otherwise clear. Hepatobiliary: 12 mm hypodensity within the central aspect of the liver noted, most consistent with a small cyst. Additional scattered sub cm hypodense lesions too small the characterize, but suspected to reflect additional small cysts. Liver is otherwise unremarkable. Gallbladder within normal limits. No biliary dilatation.  Pancreas: Pancreas demonstrates no acute abnormality. Spleen: Spleen within normal limits. Adrenals/Urinary Tract: Indeterminate 18 mm nodule within the left adrenal gland. Adrenal is otherwise unremarkable. Kidneys equal in size with symmetric enhancement. Few subcentimeter hypodense lesions within the kidneys bilaterally too small the characterize. No nephrolithiasis or hydronephrosis. No focal enhancing renal mass. Ureters of normal caliber. Bladder grossly unremarkable, although evaluation limited by streak artifact from bilateral hip arthroplasties. Stomach/Bowel: Stomach within normal limits. Small bowel of normal caliber without evidence for obstruction or acute inflammation. Appendix normal. Extensive inflammatory stranding present about multiple diverticula within the distal descending/sigmoid colon, compatible with acute diverticulitis. A foci of gas within this region are suspected to be extraluminal in location (series 5, image 51), which may reflect a small contained perforation and/or micro perforation. No discrete abscess. No associated obstruction. Vascular/Lymphatic: Normal intravascular enhancement seen throughout the intra-abdominal aorta and its branch vessels. Mild atherosclerotic disease within the aorta itself. No aneurysm. Mildly enlarged left periaortic lymph node measures up to 11 mm (series 2, image 42). This may be reactive. No other pathologically enlarged lymph nodes identified within the abdomen and pelvis. Reproductive: And ovaries grossly unremarkable. Other: No free fluid. Small fat containing paraumbilical hernia noted. Musculoskeletal: No acute osseous abnormality. 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 No worrisome lytic or blastic osseous lesions. IMPRESSION: 1. Findings consistent with acute sigmoid diverticulitis. Few small foci of gas within the area of inflammation are suspected to be  extraluminal in location, concerning for small contained perforation and/or micro perforation. No discrete abscess. No evidence for obstruction. 2. Mildly enlarged 11 mm left periaortic lymph node, suspected to be reactive. 3. 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 Critical Value/emergent results were called by telephone at the time of interpretation on 02/02/2016 at 10:55 pm to the physician assistant taking care of the patient Roxy HorsemanROBERT BROWNING , who verbally acknowledged these results. Electronically Signed   By: Rise MuBenjamin  McClintock M.D.   On: 02/02/2016 22:58    Assessment/Plan Left lower quadrant abdominal pain 2/2 sigmoid diverticulitis with micro- perforations: Acute. Patient presents with complaints of left lower lower abdominal pain - Admit to MedSurg bed - Continued IV antibiotics of ciprofloxacin and metronidazole - Pain control with hydrrocodone /Dilaudid IV prn moderate to severe pain - Clear liquid diet as tolerated, and the diet as tolerated - IVF NS at 13800ml/hr - If patient seems to not be responding to IV antibiotics may warrant consult to Gen. surgery  Leukocytosis: Acute. WBC elevated at 12.6 and likely related to above. - Repeat CBC in a.m.  Hypokalemia: Initial potassium 3.4. - Replace with 20 mEq of potassium chloride - Recheck BMP in a.m. and replace as needed - Patient may need to be on scheduled replacement as she is on  hydrochlorothiazide   Essential hypertension - Continue hydrochlorothiazide and lisinopril   DVT prophylaxis: Lovenox Code Status: Full Family Communication: No family present at bedside Disposition Plan: Likely discharge home once medically stable Consults called: None  Admission status: Observation  Clydie Braun MD [facility] Pager [phone]  If 7PM-7AM, please contact night-coverage [email or web address] Password [email or web address]  02/02/2016, 11:18 PM

## 2016-02-02 NOTE — ED Provider Notes (Signed)
[facility]-EMERGENCY DEPT Provider Note   CSN: [id number] Arrival date & time: 02/02/16  1836 By signing my name below, I, Mindy Snyder, attest that this documentation has been prepared under the direction and in the presence of Roxy Horsemanobert Vianka Ertel, PA-C. Electronically Signed: Bridgette HabermannMaria Snyder, ED Scribe. 02/02/16. 8:13 PM.  History   Chief Complaint Chief Complaint  Patient presents with  . Abdominal Pain   HPI Comments: Mindy Snyder is a 65 y.o. female with no pertinent PMHx, who presents to the Emergency Department complaining of lower abdominal pain onset ~6 weeks ago, worsening this morning. Pain is exacerbated with movement. She has not tried any OTC medications PTA. [facility] and had lab work done as well as an abdomen x-ray. [name] lab work indicated that her WBCs were slightly elevated; her x-rays indicated a focal ileus. [name] last bowel movement was this morning - she reports this was normal. Denies h/o abdominal surgeries. She further denies fever, nausea, vomiting, or any other associated symptoms.   The history is provided by the patient. No language interpreter was used.    Past Medical History:  Diagnosis Date  . Arthritis    pain and oa left hip-plans hip replacement  -- s/p right hip replacement -doing well.  pt has arthritis pain lower back, ankles, wrists and "everywhere"  . Blood transfusion 2002   with previous hip replacement     Patient Active Problem List   Diagnosis Date Noted  . Postop Transfusion history 04/06/2011  . Postop Acute blood loss anemia 03/25/2011  . Postop Hyponatremia 03/24/2011  . Osteoarthritis of hip 03/23/2011    Past Surgical History:  Procedure Laterality Date  . BREAST SURGERY     benign cyst removed from breast age 65  . JOINT REPLACEMENT  2002    right hip arthroplasty  . TOTAL HIP ARTHROPLASTY  03/23/2011   Procedure: TOTAL HIP ARTHROPLASTY;  Surgeon: Loanne DrillingFrank V Aluisio, MD;  Location: [facility];  Service: Orthopedics;   Laterality: Left;    OB History    No data available       Home Medications    Prior to Admission medications   Medication Sig Start Date End Date Taking? Authorizing Provider  rivaroxaban (XARELTO) 10 MG TABS tablet Take 1 tablet (10 mg total) by mouth daily with breakfast. 03/25/11   Alexzandrew Tessie FassL Perkins, PA-C    Family History No family history on file.  Social History Social History  Substance Use Topics  . Smoking status: Former Smoker    Packs/day: 1.00    Years: 25.00    Types: Cigarettes  . Smokeless tobacco: Never Used     Comment: stopped smoking sept 2010  . Alcohol use Yes     Comment: glass of wine daily     Allergies   Codeine   Review of Systems Review of Systems  Constitutional: Negative for fever.  Gastrointestinal: Positive for abdominal pain. Negative for nausea and vomiting.  All other systems reviewed and are negative.   Physical Exam Updated Vital Signs BP 124/70 (BP Location: Left Arm)   Pulse 92   Temp 98.4 F (36.9 C) (Oral)   Resp 18   Ht 5\' 5"  (1.651 m)   Wt 153 lb (69.4 kg)   SpO2 100%   BMI 25.46 kg/m   Physical Exam  Constitutional: She is oriented to person, place, and time. She appears well-developed and well-nourished. No distress.  HENT:  Head: Normocephalic and atraumatic.  Eyes:  EOM are normal.  Neck: Normal range of motion.  Cardiovascular: Normal rate, regular rhythm and normal heart sounds.   Pulmonary/Chest: Effort normal and breath sounds normal.  Abdominal: Soft. She exhibits no distension. There is tenderness.  Focal tenderness in LLQ, no RUQ tenderness or Murphy's sign, moderate RLQ tenderness.  Musculoskeletal: Normal range of motion.  Neurological: She is alert and oriented to person, place, and time.  Skin: Skin is warm and dry.  Psychiatric: She has a normal mood and affect. Judgment normal.  Nursing note and vitals reviewed.    ED Treatments / Results  DIAGNOSTIC STUDIES: Oxygen Saturation is  100% on RA, normal by my interpretation.    COORDINATION OF CARE: 8:13 PM Discussed treatment plan with pt at bedside which includes abdomen CT and pt agreed to plan.  Labs (all labs ordered are listed, but only abnormal results are displayed) Labs Reviewed  COMPREHENSIVE METABOLIC PANEL - Abnormal; Notable for the following:       Result Value   Potassium 3.4 (*)    Chloride 100 (*)    Glucose, Bld 105 (*)    ALT 13 (*)    All other components within normal limits  CBC - Abnormal; Notable for the following:    WBC 12.6 (*)    All other components within normal limits  LIPASE, BLOOD  URINALYSIS, ROUTINE W REFLEX MICROSCOPIC    EKG  EKG Interpretation None       Radiology No results found.  Procedures Procedures (including critical care time)  Medications Ordered in ED Medications - No data to display   Initial Impression / Assessment and Plan / ED Course  I have reviewed the triage vital signs and the nursing notes.  Pertinent labs & imaging results that were available during my care of the patient were reviewed by me and considered in my medical decision making (see chart for details).  Clinical Course    Patient with left lower quadrant pain worsening since last night. No fevers, no vomiting, normal bowel movements. Seen by urgent care earlier today, and sent to the ED for further evaluation.  CT scan is consistent with diverticulitis with microperforations, no abscess. Will start on cipro and flagyl.    Appreciate Dr. Katrinka BlazingSmith for admitting the patient for observation in the hospital.  Discussed plan with patient.  Final Clinical Impressions(s) / ED Diagnoses   Final diagnoses:  Diverticulitis of large intestine with perforation without abscess or bleeding    New Prescriptions New Prescriptions   No medications on file   I personally performed the services described in this documentation, which was scribed in my presence. The recorded information has  been reviewed and is accurate.      Roxy Horsemanobert Mindy Betsch, PA-C 02/02/16 2322    Geoffery Lyonsouglas Delo, MD 02/03/16 Lyda [id number]

## 2016-02-02 NOTE — Progress Notes (Signed)
Patient listed as having [employer] insurance without a pcp.  EDCM spoke to patient at bedside.  Patient reports her pcp is [facility] [employer].  EDCM unable to find this provider.

## 2016-02-03 DIAGNOSIS — E860 Dehydration: Secondary | ICD-10-CM | POA: Diagnosis present

## 2016-02-03 DIAGNOSIS — R1032 Left lower quadrant pain: Secondary | ICD-10-CM | POA: Diagnosis present

## 2016-02-03 DIAGNOSIS — Z885 Allergy status to narcotic agent status: Secondary | ICD-10-CM | POA: Diagnosis not present

## 2016-02-03 DIAGNOSIS — M1990 Unspecified osteoarthritis, unspecified site: Secondary | ICD-10-CM | POA: Diagnosis present

## 2016-02-03 DIAGNOSIS — E039 Hypothyroidism, unspecified: Secondary | ICD-10-CM | POA: Diagnosis present

## 2016-02-03 DIAGNOSIS — Z96641 Presence of right artificial hip joint: Secondary | ICD-10-CM | POA: Diagnosis present

## 2016-02-03 DIAGNOSIS — K5720 Diverticulitis of large intestine with perforation and abscess without bleeding: Secondary | ICD-10-CM | POA: Diagnosis present

## 2016-02-03 DIAGNOSIS — E876 Hypokalemia: Secondary | ICD-10-CM | POA: Diagnosis present

## 2016-02-03 DIAGNOSIS — Z87891 Personal history of nicotine dependence: Secondary | ICD-10-CM | POA: Diagnosis not present

## 2016-02-03 DIAGNOSIS — Z79899 Other long term (current) drug therapy: Secondary | ICD-10-CM | POA: Diagnosis not present

## 2016-02-03 DIAGNOSIS — I10 Essential (primary) hypertension: Secondary | ICD-10-CM | POA: Diagnosis present

## 2016-02-03 LAB — BASIC METABOLIC PANEL
ANION GAP: 6 (ref 5–15)
BUN: 12 mg/dL (ref 6–20)
CHLORIDE: 102 mmol/L (ref 101–111)
CO2: 29 mmol/L (ref 22–32)
Calcium: 8.7 mg/dL — ABNORMAL LOW (ref 8.9–10.3)
Creatinine, Ser: 0.82 mg/dL (ref 0.44–1.00)
GFR calc Af Amer: >60 mL/min (ref >60)
GLUCOSE: 159 mg/dL — AB (ref 65–99)
POTASSIUM: 4.2 mmol/L (ref 3.5–5.1)
Sodium: 137 mmol/L (ref 135–145)

## 2016-02-03 LAB — CBC
HEMATOCRIT: 35.8 % — AB (ref 36.0–46.0)
HEMOGLOBIN: 11.9 g/dL — AB (ref 12.0–15.0)
MCH: 31.3 pg (ref 26.0–34.0)
MCHC: 33.2 g/dL (ref 30.0–36.0)
MCV: 94.2 fL (ref 78.0–100.0)
Platelets: 217 10*3/uL (ref 150–400)
RBC: 3.8 MIL/uL — ABNORMAL LOW (ref 3.87–5.11)
RDW: 13.4 % (ref 11.5–15.5)
WBC: 7.7 10*3/uL (ref 4.0–10.5)

## 2016-02-03 MED ORDER — CIPROFLOXACIN IN D5W 400 MG/200ML IV SOLN
400.0000 mg | Freq: Two times a day (BID) | INTRAVENOUS | Status: DC
  Administered 2016-02-03 – 2016-02-05 (×4): 400 mg via INTRAVENOUS
  Filled 2016-02-03 (×4): qty 200

## 2016-02-03 MED ORDER — HYDRALAZINE HCL 20 MG/ML IJ SOLN: 10.0000 mg | Freq: Four times a day (QID) | INTRAMUSCULAR | Status: DC | PRN

## 2016-02-03 NOTE — Progress Notes (Signed)
Pt arrived to room 1514 from ED. Pt A&Ox4. Has left arm IV infusing. Skin intact. Pt oriented to room, call bell, etc. States pain is managed at this time. Has no complaints. Will continue to monitor.

## 2016-02-03 NOTE — Consult Note (Signed)
Reason for Consult: sigmoid diverticulitis with microperforation Referring Physician: Dr. Kevin Fenton Mindy Snyder is an 65 y.o. female.  HPI: Pt presented to [facility] with 6 weeks of abdominal pain. She was referred to the ED here with a CT scan showing Sigmoid diverticulitis with some foci of gas, concerning for perforation.  She has been admitted to Medicine and we are ask to see.  Work up in the ED shows she is afebrile, VSS  Past Medical History:  Diagnosis Date  . Arthritis    pain and oa left hip-plans hip replacement  -- s/p right hip replacement -doing well.  pt has arthritis pain lower back, ankles, wrists and "everywhere"  . Blood transfusion 2002   with previous hip replacement     Past Surgical History:  Procedure Laterality Date  . BREAST SURGERY     benign cyst removed from breast age 48  . JOINT REPLACEMENT  2002    right hip arthroplasty  . TOTAL HIP ARTHROPLASTY  03/23/2011   Procedure: TOTAL HIP ARTHROPLASTY;  Surgeon: Gearlean Alf, MD;  Location: [facility];  Service: Orthopedics;  Laterality: Left;    No family history on file.  Social History:  reports that she has quit smoking. Her smoking use included Cigarettes. She has a 25.00 pack-year smoking history. She has never used smokeless tobacco. She reports that she drinks alcohol. She reports that she does not use drugs.  Allergies:  Allergies  Allergen Reactions  . Codeine Other (See Comments) and Nausea Only    Upset Stomach    Medications: I have reviewed the patient's current medications.  Results for orders placed or performed during the hospital encounter of 02/02/16 (from the past 48 hour(s))  Urinalysis, Routine w reflex microscopic     Status: Abnormal   Collection Time: 02/02/16  6:50 PM  Result Value Ref Range   Color, Urine YELLOW YELLOW   APPearance CLEAR CLEAR   Specific Gravity, Urine >1.046 (H) 1.005 - 1.030   pH 6.0 5.0 - 8.0   Glucose, UA NEGATIVE NEGATIVE mg/dL    Hgb urine dipstick NEGATIVE NEGATIVE   Bilirubin Urine NEGATIVE NEGATIVE   Ketones, ur NEGATIVE NEGATIVE mg/dL   Protein, ur NEGATIVE NEGATIVE mg/dL   Nitrite NEGATIVE NEGATIVE   Leukocytes, UA TRACE (A) NEGATIVE   RBC / HPF 0-5 0 - 5 RBC/hpf   WBC, UA 0-5 0 - 5 WBC/hpf   Bacteria, UA NONE SEEN NONE SEEN   Squamous Epithelial / LPF 0-5 (A) NONE SEEN  Lipase, blood     Status: None   Collection Time: 02/02/16  7:05 PM  Result Value Ref Range   Lipase 24 11 - 51 U/L  Comprehensive metabolic panel     Status: Abnormal   Collection Time: 02/02/16  7:05 PM  Result Value Ref Range   Sodium 136 135 - 145 mmol/L   Potassium 3.4 (L) 3.5 - 5.1 mmol/L   Chloride 100 (L) 101 - 111 mmol/L   CO2 26 22 - 32 mmol/L   Glucose, Bld 105 (H) 65 - 99 mg/dL   BUN 14 6 - 20 mg/dL   Creatinine, Ser 0.58 0.44 - 1.00 mg/dL   Calcium 9.4 8.9 - 10.3 mg/dL   Total Protein 7.5 6.5 - 8.1 g/dL   Albumin 4.4 3.5 - 5.0 g/dL   AST 15 15 - 41 U/L   ALT 13 (L) 14 - 54 U/L   Alkaline Phosphatase 86 38 - 126  U/L   Total Bilirubin 0.8 0.3 - 1.2 mg/dL   GFR calc non Af Amer >60 >60 mL/min   GFR calc Af Amer >60 >60 mL/min    Comment: (NOTE) The eGFR has been calculated using the CKD EPI equation. This calculation has not been validated in all clinical situations. eGFR's persistently <60 mL/min signify possible Chronic Kidney Disease.    Anion gap 10 5 - 15  CBC     Status: Abnormal   Collection Time: 02/02/16  7:05 PM  Result Value Ref Range   WBC 12.6 (H) 4.0 - 10.5 K/uL   RBC 4.31 3.87 - 5.11 MIL/uL   Hemoglobin 13.8 12.0 - 15.0 g/dL   HCT 41.4 36.0 - 46.0 %   MCV 96.1 78.0 - 100.0 fL   MCH 32.0 26.0 - 34.0 pg   MCHC 33.3 30.0 - 36.0 g/dL   RDW 13.5 11.5 - 15.5 %   Platelets 245 150 - 400 K/uL  Basic metabolic panel     Status: Abnormal   Collection Time: 02/03/16  5:21 AM  Result Value Ref Range   Sodium 137 135 - 145 mmol/L   Potassium 4.2 3.5 - 5.1 mmol/L    Comment: DELTA CHECK NOTED SLIGHT  HEMOLYSIS    Chloride 102 101 - 111 mmol/L   CO2 29 22 - 32 mmol/L   Glucose, Bld 159 (H) 65 - 99 mg/dL   BUN 12 6 - 20 mg/dL   Creatinine, Ser 0.82 0.44 - 1.00 mg/dL   Calcium 8.7 (L) 8.9 - 10.3 mg/dL   GFR calc non Af Amer >60 >60 mL/min   GFR calc Af Amer >60 >60 mL/min    Comment: (NOTE) The eGFR has been calculated using the CKD EPI equation. This calculation has not been validated in all clinical situations. eGFR's persistently <60 mL/min signify possible Chronic Kidney Disease.    Anion gap 6 5 - 15  CBC     Status: Abnormal   Collection Time: 02/03/16  5:21 AM  Result Value Ref Range   WBC 7.7 4.0 - 10.5 K/uL   RBC 3.80 (L) 3.87 - 5.11 MIL/uL   Hemoglobin 11.9 (L) 12.0 - 15.0 g/dL   HCT 35.8 (L) 36.0 - 46.0 %   MCV 94.2 78.0 - 100.0 fL   MCH 31.3 26.0 - 34.0 pg   MCHC 33.2 30.0 - 36.0 g/dL   RDW 13.4 11.5 - 15.5 %   Platelets 217 150 - 400 K/uL    Ct Abdomen Pelvis W Contrast  Result Date: 02/02/2016 CLINICAL DATA:  Initial evaluation for severe left lower quadrant abdominal pain. EXAM: CT ABDOMEN AND PELVIS WITH CONTRAST TECHNIQUE: Multidetector CT imaging of the abdomen and pelvis was performed using the standard protocol following bolus administration of intravenous contrast. CONTRAST:  125m ISOVUE-300 IOPAMIDOL (ISOVUE-300) INJECTION 61% COMPARISON:  None available. FINDINGS: Lower chest: Mild atelectatic changes present within the visualized lung bases. The fat containing Bochdalek's type hernia present at the right lung base. Visualized lungs are otherwise clear. Hepatobiliary: 12 mm hypodensity within the central aspect of the liver noted, most consistent with a small cyst. Additional scattered sub cm hypodense lesions too small the characterize, but suspected to reflect additional small cysts. Liver is otherwise unremarkable. Gallbladder within normal limits. No biliary dilatation. Pancreas: Pancreas demonstrates no acute abnormality. Spleen: Spleen within normal  limits. Adrenals/Urinary Tract: Indeterminate 18 mm nodule within the left adrenal gland. Adrenal is otherwise unremarkable. Kidneys equal in size with symmetric enhancement. Few  subcentimeter hypodense lesions within the kidneys bilaterally too small the characterize. No nephrolithiasis or hydronephrosis. No focal enhancing renal mass. Ureters of normal caliber. Bladder grossly unremarkable, although evaluation limited by streak artifact from bilateral hip arthroplasties. Stomach/Bowel: Stomach within normal limits. Small bowel of normal caliber without evidence for obstruction or acute inflammation. Appendix normal. Extensive inflammatory stranding present about multiple diverticula within the distal descending/sigmoid colon, compatible with acute diverticulitis. A foci of gas within this region are suspected to be extraluminal in location (series 5, image 51), which may reflect a small contained perforation and/or micro perforation. No discrete abscess. No associated obstruction. Vascular/Lymphatic: Normal intravascular enhancement seen throughout the intra-abdominal aorta and its branch vessels. Mild atherosclerotic disease within the aorta itself. No aneurysm. Mildly enlarged left periaortic lymph node measures up to 11 mm (series 2, image 42). This may be reactive. No other pathologically enlarged lymph nodes identified within the abdomen and pelvis. Reproductive: And ovaries grossly unremarkable. Other: No free fluid. Small fat containing paraumbilical hernia noted. Musculoskeletal: No acute osseous abnormality. 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 No worrisome lytic or blastic osseous lesions. IMPRESSION: 1. Findings consistent with acute sigmoid diverticulitis. Few small foci of gas within the area of inflammation are suspected to be extraluminal in location, concerning for small contained perforation and/or micro perforation.  No discrete abscess. No evidence for obstruction. 2. Mildly enlarged 11 mm left periaortic lymph node, suspected to be reactive. 3. 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 Critical Value/emergent results were called by telephone at the time of interpretation on 02/02/2016 at 10:55 pm to the physician assistant taking care of the patient Montine Circle , who verbally acknowledged these results. Electronically Signed   By: Jeannine Boga M.D.   On: 02/02/2016 22:58    ROS Blood pressure (!) 100/57, pulse 75, temperature 97.9 F (36.6 C), temperature source Oral, resp. rate 18, height '5\' 5"'$  (1.651 m), weight 69.4 kg (153 lb), SpO2 98 %. Physical Exam  Assessment/Plan: Will observe the patient on antibiotics.  She can probably be transitioned to oral antibiotics and maintained on low residue diet.    JENNINGS,WILLARD 02/03/2016, 3:36 PM

## 2016-02-03 NOTE — Consult Note (Signed)
Chief Complaint:  diverticulitis  History of Present Illness:  Mindy Snyder is an 65 y.o. female who was admitted with diverticulitis and [facility] MD was consulted to see.  I have discussed the findings with her and hope that she can be managed without surgical intervention at this time.    Past Medical History:  Diagnosis Date  . Arthritis    pain and oa left hip-plans hip replacement  -- s/p right hip replacement -doing well.  pt has arthritis pain lower back, ankles, wrists and "everywhere"  . Blood transfusion 2002   with previous hip replacement     Past Surgical History:  Procedure Laterality Date  . BREAST SURGERY     benign cyst removed from breast age 83  . JOINT REPLACEMENT  2002    right hip arthroplasty  . TOTAL HIP ARTHROPLASTY  03/23/2011   Procedure: TOTAL HIP ARTHROPLASTY;  Surgeon: Loanne Drilling, MD;  Location: [facility];  Service: Orthopedics;  Laterality: Left;    Current Facility-Administered Medications  Medication Dose Route Frequency Provider Last Rate Last Dose  . 0.9 %  sodium chloride infusion   Intravenous Continuous Shon Hale, MD 75 mL/hr at 02/03/16 1014    . acetaminophen (TYLENOL) tablet 650 mg  650 mg Oral Q6H PRN Clydie Braun, MD   650 mg at 02/03/16 0504   Or  . acetaminophen (TYLENOL) suppository 650 mg  650 mg Rectal Q6H PRN Clydie Braun, MD      . ciprofloxacin (CIPRO) IVPB 400 mg  400 mg Intravenous Q12H Shon Hale, MD   400 mg at 02/03/16 1439  . enoxaparin (LOVENOX) injection 40 mg  40 mg Subcutaneous Q24H Rondell A Katrinka Blazing, MD   40 mg at 02/03/16 1014  . hydrALAZINE (APRESOLINE) injection 10 mg  10 mg Intravenous Q6H PRN Shon Hale, MD      . HYDROcodone-acetaminophen (NORCO/VICODIN) 5-325 MG per tablet 1-2 tablet  1-2 tablet Oral Q4H PRN Clydie Braun, MD      . HYDROmorphone (DILAUDID) injection 1 mg  1 mg Intravenous Q3H PRN Clydie Braun, MD      . lisinopril (PRINIVIL,ZESTRIL) tablet 20 mg  20 mg Oral Daily Clydie Braun, MD   20 mg at 02/03/16 1018  . metroNIDAZOLE (FLAGYL) IVPB 500 mg  500 mg Intravenous Q8H Rondell Burtis Junes, MD   500 mg at 02/03/16 1646  . ondansetron (ZOFRAN) tablet 4 mg  4 mg Oral Q6H PRN Clydie Braun, MD       Or  . ondansetron (ZOFRAN) injection 4 mg  4 mg Intravenous Q6H PRN Clydie Braun, MD   4 mg at 02/03/16 0129   Codeine No family history on file. Social History:   reports that she has quit smoking. Her smoking use included Cigarettes. She has a 25.00 pack-year smoking history. She has never used smokeless tobacco. She reports that she drinks alcohol. She reports that she does not use drugs.   REVIEW OF SYSTEMS : Negative except for see problem list  Physical Exam:   Blood pressure (!) 100/57, pulse 75, temperature 97.9 F (36.6 C), temperature source Oral, resp. rate 18, height 5\' 5"  (1.651 m), weight 69.4 kg (153 lb), SpO2 98 %. Body mass index is 25.46 kg/m.  Gen:  WDWN WF NAD  Not having much abdominal pain at the present and is much better than at admission.   LABORATORY RESULTS: Results for orders placed or performed during the hospital  encounter of 02/02/16 (from the past 48 hour(s))  Urinalysis, Routine w reflex microscopic     Status: Abnormal   Collection Time: 02/02/16  6:50 PM  Result Value Ref Range   Color, Urine YELLOW YELLOW   APPearance CLEAR CLEAR   Specific Gravity, Urine >1.046 (H) 1.005 - 1.030   pH 6.0 5.0 - 8.0   Glucose, UA NEGATIVE NEGATIVE mg/dL   Hgb urine dipstick NEGATIVE NEGATIVE   Bilirubin Urine NEGATIVE NEGATIVE   Ketones, ur NEGATIVE NEGATIVE mg/dL   Protein, ur NEGATIVE NEGATIVE mg/dL   Nitrite NEGATIVE NEGATIVE   Leukocytes, UA TRACE (A) NEGATIVE   RBC / HPF 0-5 0 - 5 RBC/hpf   WBC, UA 0-5 0 - 5 WBC/hpf   Bacteria, UA NONE SEEN NONE SEEN   Squamous Epithelial / LPF 0-5 (A) NONE SEEN  Lipase, blood     Status: None   Collection Time: 02/02/16  7:05 PM  Result Value Ref Range   Lipase 24 11 - 51 U/L   Comprehensive metabolic panel     Status: Abnormal   Collection Time: 02/02/16  7:05 PM  Result Value Ref Range   Sodium 136 135 - 145 mmol/L   Potassium 3.4 (L) 3.5 - 5.1 mmol/L   Chloride 100 (L) 101 - 111 mmol/L   CO2 26 22 - 32 mmol/L   Glucose, Bld 105 (H) 65 - 99 mg/dL   BUN 14 6 - 20 mg/dL   Creatinine, Ser 0.58 0.44 - 1.00 mg/dL   Calcium 9.4 8.9 - 10.3 mg/dL   Total Protein 7.5 6.5 - 8.1 g/dL   Albumin 4.4 3.5 - 5.0 g/dL   AST 15 15 - 41 U/L   ALT 13 (L) 14 - 54 U/L   Alkaline Phosphatase 86 38 - 126 U/L   Total Bilirubin 0.8 0.3 - 1.2 mg/dL   GFR calc non Af Amer >60 >60 mL/min   GFR calc Af Amer >60 >60 mL/min    Comment: (NOTE) The eGFR has been calculated using the CKD EPI equation. This calculation has not been validated in all clinical situations. eGFR's persistently <60 mL/min signify possible Chronic Kidney Disease.    Anion gap 10 5 - 15  CBC     Status: Abnormal   Collection Time: 02/02/16  7:05 PM  Result Value Ref Range   WBC 12.6 (H) 4.0 - 10.5 K/uL   RBC 4.31 3.87 - 5.11 MIL/uL   Hemoglobin 13.8 12.0 - 15.0 g/dL   HCT 41.4 36.0 - 46.0 %   MCV 96.1 78.0 - 100.0 fL   MCH 32.0 26.0 - 34.0 pg   MCHC 33.3 30.0 - 36.0 g/dL   RDW 13.5 11.5 - 15.5 %   Platelets 245 150 - 400 K/uL  Basic metabolic panel     Status: Abnormal   Collection Time: 02/03/16  5:21 AM  Result Value Ref Range   Sodium 137 135 - 145 mmol/L   Potassium 4.2 3.5 - 5.1 mmol/L    Comment: DELTA CHECK NOTED SLIGHT HEMOLYSIS    Chloride 102 101 - 111 mmol/L   CO2 29 22 - 32 mmol/L   Glucose, Bld 159 (H) 65 - 99 mg/dL   BUN 12 6 - 20 mg/dL   Creatinine, Ser 0.82 0.44 - 1.00 mg/dL   Calcium 8.7 (L) 8.9 - 10.3 mg/dL   GFR calc non Af Amer >60 >60 mL/min   GFR calc Af Amer >60 >60 mL/min    Comment: (NOTE)  The eGFR has been calculated using the CKD EPI equation. This calculation has not been validated in all clinical situations. eGFR's persistently <60 mL/min signify possible  Chronic Kidney Disease.    Anion gap 6 5 - 15  CBC     Status: Abnormal   Collection Time: 02/03/16  5:21 AM  Result Value Ref Range   WBC 7.7 4.0 - 10.5 K/uL   RBC 3.80 (L) 3.87 - 5.11 MIL/uL   Hemoglobin 11.9 (L) 12.0 - 15.0 g/dL   HCT 35.8 (L) 36.0 - 46.0 %   MCV 94.2 78.0 - 100.0 fL   MCH 31.3 26.0 - 34.0 pg   MCHC 33.2 30.0 - 36.0 g/dL   RDW 13.4 11.5 - 15.5 %   Platelets 217 150 - 400 K/uL     RADIOLOGY RESULTS: Ct Abdomen Pelvis W Contrast  Result Date: 02/02/2016 CLINICAL DATA:  Initial evaluation for severe left lower quadrant abdominal pain. EXAM: CT ABDOMEN AND PELVIS WITH CONTRAST TECHNIQUE: Multidetector CT imaging of the abdomen and pelvis was performed using the standard protocol following bolus administration of intravenous contrast. CONTRAST:  140m ISOVUE-300 IOPAMIDOL (ISOVUE-300) INJECTION 61% COMPARISON:  None available. FINDINGS: Lower chest: Mild atelectatic changes present within the visualized lung bases. The fat containing Bochdalek's type hernia present at the right lung base. Visualized lungs are otherwise clear. Hepatobiliary: 12 mm hypodensity within the central aspect of the liver noted, most consistent with a small cyst. Additional scattered sub cm hypodense lesions too small the characterize, but suspected to reflect additional small cysts. Liver is otherwise unremarkable. Gallbladder within normal limits. No biliary dilatation. Pancreas: Pancreas demonstrates no acute abnormality. Spleen: Spleen within normal limits. Adrenals/Urinary Tract: Indeterminate 18 mm nodule within the left adrenal gland. Adrenal is otherwise unremarkable. Kidneys equal in size with symmetric enhancement. Few subcentimeter hypodense lesions within the kidneys bilaterally too small the characterize. No nephrolithiasis or hydronephrosis. No focal enhancing renal mass. Ureters of normal caliber. Bladder grossly unremarkable, although evaluation limited by streak artifact from bilateral  hip arthroplasties. Stomach/Bowel: Stomach within normal limits. Small bowel of normal caliber without evidence for obstruction or acute inflammation. Appendix normal. Extensive inflammatory stranding present about multiple diverticula within the distal descending/sigmoid colon, compatible with acute diverticulitis. A foci of gas within this region are suspected to be extraluminal in location (series 5, image 51), which may reflect a small contained perforation and/or micro perforation. No discrete abscess. No associated obstruction. Vascular/Lymphatic: Normal intravascular enhancement seen throughout the intra-abdominal aorta and its branch vessels. Mild atherosclerotic disease within the aorta itself. No aneurysm. Mildly enlarged left periaortic lymph node measures up to 11 mm (series 2, image 42). This may be reactive. No other pathologically enlarged lymph nodes identified within the abdomen and pelvis. Reproductive: And ovaries grossly unremarkable. Other: No free fluid. Small fat containing paraumbilical hernia noted. Musculoskeletal: No acute osseous abnormality. 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 No worrisome lytic or blastic osseous lesions. IMPRESSION: 1. Findings consistent with acute sigmoid diverticulitis. Few small foci of gas within the area of inflammation are suspected to be extraluminal in location, concerning for small contained perforation and/or micro perforation. No discrete abscess. No evidence for obstruction. 2. Mildly enlarged 11 mm left periaortic lymph node, suspected to be reactive. 3. 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 Critical Value/emergent results were called by telephone at the time of interpretation on 02/02/2016 at 10:55 pm to the physician assistant  taking care of the patient Montine Circle , who verbally acknowledged these results. Electronically Signed    By: Jeannine Boga M.D.   On: 02/02/2016 22:58    Problem List: Patient Active Problem List   Diagnosis Date Noted  . Diverticulitis 02/02/2016  . Perforation of sigmoid colon due to diverticulitis 02/02/2016  . Essential hypertension 02/02/2016  . Hypokalemia 02/02/2016  . Leukocytosis 02/02/2016  . Postop Transfusion history 04/06/2011  . Postop Acute blood loss anemia 03/25/2011  . Postop Hyponatremia 03/24/2011  . Osteoarthritis of hip 03/23/2011    Assessment & Plan: Will follow with you.      Matt B. Hassell Done, MD, [facility] [phone] beeper [phone]  02/03/2016 4:54 PM

## 2016-02-03 NOTE — Progress Notes (Signed)
Patient Demographics:    Mindy Snyder, is a 65 y.o. female, DOB - 09/30/1950, NWG:[id number]  Admit date - 02/02/2016   Admitting Physician Clydie Braun, MD  Outpatient Primary MD for the patient is PROVIDER NOT IN SYSTEM  LOS - 0   Chief Complaint  Patient presents with  . Abdominal Pain        Subjective:    Mindy Snyder today has no fevers, no emesis,  No chest pain, abdominal pain is better, tolerating clear liquids well   Assessment  & Plan :    Principal Problem:   Perforation of sigmoid colon due to diverticulitis Active Problems:   Essential hypertension   Hypokalemia   Leukocytosis   1)Sigmoid diverticulitis with microperforations but without Homero Fellers Abscess-Overall improving, no fevers or chills, no vomiting or diarrhea, white count is down to 7.7 from 12.6, no fevers, consider surgical consultation if fails empiric antibiotic treatment. Okay to continue Cipro and Flagyl started on 02/02/2016,Continue clear liquid diet, Consider advancing diet on 02/04/2016. Case discussed with physician assistant from on-call surgical team, patient will be seen later but surgical team.  '  2)FEN/Hypothyroidism- replace potassium, diet as above in #1  3)HTN- hold HCTZ  (due to dehydration and hypokalemia), may restart HCTZ when patient is euvolemic, continue lisinopril,  may use IV Hydralazine 10 mg  Every 4 hours Prn for systolic blood pressure over 160 mmhg   Disposition Plan  : Anticipate discharge home with clinical improvement in a couple days  Consults  :  Discussed with surgical team on 02/03/2016, official surgical consult pending   DVT Prophylaxis  :  Lovenox   Lab Results  Component Value Date   PLT 217 02/03/2016    Inpatient Medications  Scheduled Meds: . enoxaparin (LOVENOX) injection  40 mg Subcutaneous Q24H  . hydrochlorothiazide  25 mg Oral Daily  . iopamidol        . lisinopril  20 mg Oral Daily  . metronidazole  500 mg Intravenous Q8H  . sodium chloride       Continuous Infusions: . sodium chloride 100 mL/hr at 02/03/16 0808   PRN Meds:.acetaminophen **OR** acetaminophen, HYDROcodone-acetaminophen, HYDROmorphone (DILAUDID) injection, ondansetron **OR** ondansetron (ZOFRAN) IV    Anti-infectives    Start     Dose/Rate Route Frequency Ordered Stop   02/03/16 0800  metroNIDAZOLE (FLAGYL) IVPB 500 mg     500 mg 100 mL/hr over 60 Minutes Intravenous Every 8 hours 02/02/16 2352     02/02/16 2300  ciprofloxacin (CIPRO) IVPB 400 mg     400 mg 200 mL/hr over 60 Minutes Intravenous  Once 02/02/16 2259 02/03/16 0120   02/02/16 2300  metroNIDAZOLE (FLAGYL) IVPB 500 mg     500 mg 100 mL/hr over 60 Minutes Intravenous  Once 02/02/16 2259 02/03/16 0020        Objective:   Vitals:   02/02/16 2104 02/03/16 0038 02/03/16 0043 02/03/16 0430  BP: 127/72 98/60 104/62 (!) 94/58  Pulse: 87 77  77  Resp: 16 18  18   Temp: 98.5 F (36.9 C) 98.1 F (36.7 C)  98.3 F (36.8 C)  TempSrc: Oral Oral  Oral  SpO2: 94% 99%  97%  Weight:      Height:  Wt Readings from Last 3 Encounters:  02/02/16 69.4 kg (153 lb)  03/24/11 67 kg (147 lb 11.3 oz)  03/16/11 66.7 kg (147 lb)     Intake/Output Summary (Last 24 hours) at 02/03/16 0928 Last data filed at 02/03/16 0800  Gross per 24 hour  Intake           561.25 ml  Output                0 ml  Net           561.25 ml     Physical Exam  Gen:- Awake Alert, in no apparent distress  HEENT:- [place].AT, No sclera icterus Neck-Supple Neck,No JVD,.  Lungs-  CTAB  CV- S1, S2 normal Abd-  +ve B.Sounds, Abd Soft, lower abdominal area tenderness,   left more than right, no rebound or guarding, no CVA area tenderness Extremity/Skin:- No  edema,   warm and dry    Data Review:   Micro Results No results found for this or any previous visit (from the past 240 hour(s)).  Radiology Reports Ct Abdomen Pelvis  W Contrast  Result Date: 02/02/2016 CLINICAL DATA:  Initial evaluation for severe left lower quadrant abdominal pain. EXAM: CT ABDOMEN AND PELVIS WITH CONTRAST TECHNIQUE: Multidetector CT imaging of the abdomen and pelvis was performed using the standard protocol following bolus administration of intravenous contrast. CONTRAST:  100mL ISOVUE-300 IOPAMIDOL (ISOVUE-300) INJECTION 61% COMPARISON:  None available. FINDINGS: Lower chest: Mild atelectatic changes present within the visualized lung bases. The fat containing Bochdalek's type hernia present at the right lung base. Visualized lungs are otherwise clear. Hepatobiliary: 12 mm hypodensity within the central aspect of the liver noted, most consistent with a small cyst. Additional scattered sub cm hypodense lesions too small the characterize, but suspected to reflect additional small cysts. Liver is otherwise unremarkable. Gallbladder within normal limits. No biliary dilatation. Pancreas: Pancreas demonstrates no acute abnormality. Spleen: Spleen within normal limits. Adrenals/Urinary Tract: Indeterminate 18 mm nodule within the left adrenal gland. Adrenal is otherwise unremarkable. Kidneys equal in size with symmetric enhancement. Few subcentimeter hypodense lesions within the kidneys bilaterally too small the characterize. No nephrolithiasis or hydronephrosis. No focal enhancing renal mass. Ureters of normal caliber. Bladder grossly unremarkable, although evaluation limited by streak artifact from bilateral hip arthroplasties. Stomach/Bowel: Stomach within normal limits. Small bowel of normal caliber without evidence for obstruction or acute inflammation. Appendix normal. Extensive inflammatory stranding present about multiple diverticula within the distal descending/sigmoid colon, compatible with acute diverticulitis. A foci of gas within this region are suspected to be extraluminal in location (series 5, image 51), which may reflect a small contained  perforation and/or micro perforation. No discrete abscess. No associated obstruction. Vascular/Lymphatic: Normal intravascular enhancement seen throughout the intra-abdominal aorta and its branch vessels. Mild atherosclerotic disease within the aorta itself. No aneurysm. Mildly enlarged left periaortic lymph node measures up to 11 mm (series 2, image 42). This may be reactive. No other pathologically enlarged lymph nodes identified within the abdomen and pelvis. Reproductive: And ovaries grossly unremarkable. Other: No free fluid. Small fat containing paraumbilical hernia noted. Musculoskeletal: No acute osseous abnormality. 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 No worrisome lytic or blastic osseous lesions. IMPRESSION: 1. Findings consistent with acute sigmoid diverticulitis. Few small foci of gas within the area of inflammation are suspected to be extraluminal in location, concerning for small contained perforation and/or micro perforation. No discrete abscess. No evidence for  obstruction. 2. Mildly enlarged 11 mm left periaortic lymph node, suspected to be reactive. 3. 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 Critical Value/emergent results were called by telephone at the time of interpretation on 02/02/2016 at 10:55 pm to the physician assistant taking care of the patient Roxy HorsemanROBERT BROWNING , who verbally acknowledged these results. Electronically Signed   By: Rise MuBenjamin  McClintock M.D.   On: 02/02/2016 22:58     CBC  Recent Labs Lab 02/02/16 1905 02/03/16 0521  WBC 12.6* 7.7  HGB 13.8 11.9*  HCT 41.4 35.8*  PLT 245 217  MCV 96.1 94.2  MCH 32.0 31.3  MCHC 33.3 33.2  RDW 13.5 13.4    Chemistries   Recent Labs Lab 02/02/16 1905 02/03/16 0521  NA 136 137  K 3.4* 4.2  CL 100* 102  CO2 26 29  GLUCOSE 105* 159*  BUN 14 12  CREATININE 0.58 0.82  CALCIUM 9.4 8.7*  AST 15  --     ALT 13*  --   ALKPHOS 86  --   BILITOT 0.8  --    ------------------------------------------------------------------------------------------------------------------ No results for input(s): CHOL, HDL, LDLCALC, TRIG, CHOLHDL, LDLDIRECT in the last 72 hours.  No results found for: HGBA1C ------------------------------------------------------------------------------------------------------------------ No results for input(s): TSH, T4TOTAL, T3FREE, THYROIDAB in the last 72 hours.  Invalid input(s): FREET3 ------------------------------------------------------------------------------------------------------------------ No results for input(s): VITAMINB12, FOLATE, FERRITIN, TIBC, IRON, RETICCTPCT in the last 72 hours.  Coagulation profile No results for input(s): INR, PROTIME in the last 168 hours.  No results for input(s): DDIMER in the last 72 hours.  Cardiac Enzymes No results for input(s): CKMB, TROPONINI, MYOGLOBIN in the last 168 hours.  Invalid input(s): CK ------------------------------------------------------------------------------------------------------------------ No results found for: BNP   Alexander Aument M.D on 02/03/2016 at 9:28 AM  Between 7am to 7pm - Pager - [phone]  After 7pm go to [email or web address] - password [email or web address]  [facility] -  Office  [phone]  Dragon dictation system was used to create this note, attempts have been made to correct errors, however presence of uncorrected errors is not a reflection quality of care provided

## 2016-02-03 NOTE — Care Management Note (Signed)
Case Management Note  Patient Details  Name: Mindy Snyder MRN: [id number] Date of Birth: [birth year]  Subjective/Objective:  65 y/o f admitted w/Perforation of sigmoid colon. From home.                  Action/Plan:d/c plan home.   Expected Discharge Date:                  Expected Discharge Plan:  Home/Self Care  In-House Referral:     Discharge planning Services  CM Consult  Post Acute Care Choice:    Choice offered to:     DME Arranged:    DME Agency:     HH Arranged:    HH Agency:     Status of Service:  In process, will continue to follow  If discussed at Long Length of Stay Meetings, dates discussed:    Additional Comments:  Mindy Snyder, Mindy Stegemann, RN 02/03/2016, 2:22 PM

## 2016-02-04 LAB — BASIC METABOLIC PANEL
Anion gap: 6 (ref 5–15)
BUN: 7 mg/dL (ref 6–20)
CO2: 25 mmol/L (ref 22–32)
Calcium: 8.3 mg/dL — ABNORMAL LOW (ref 8.9–10.3)
Chloride: 109 mmol/L (ref 101–111)
Creatinine, Ser: 0.56 mg/dL (ref 0.44–1.00)
GFR calc Af Amer: >60 mL/min (ref >60)
GFR calc non Af Amer: >60 mL/min (ref >60)
Glucose, Bld: 102 mg/dL — ABNORMAL HIGH (ref 65–99)
Potassium: 3.7 mmol/L (ref 3.5–5.1)
Sodium: 140 mmol/L (ref 135–145)

## 2016-02-04 LAB — CBC
HEMATOCRIT: 33.8 % — AB (ref 36.0–46.0)
HEMOGLOBIN: 11.1 g/dL — AB (ref 12.0–15.0)
MCH: 31.9 pg (ref 26.0–34.0)
MCHC: 32.8 g/dL (ref 30.0–36.0)
MCV: 97.1 fL (ref 78.0–100.0)
Platelets: 216 10*3/uL (ref 150–400)
RBC: 3.48 MIL/uL — ABNORMAL LOW (ref 3.87–5.11)
RDW: 13.5 % (ref 11.5–15.5)
WBC: 4.2 10*3/uL (ref 4.0–10.5)

## 2016-02-04 MED ORDER — SACCHAROMYCES BOULARDII 250 MG PO CAPS
250.0000 mg | ORAL_CAPSULE | Freq: Two times a day (BID) | ORAL | Status: DC
  Administered 2016-02-04 – 2016-02-05 (×3): 250 mg via ORAL
  Filled 2016-02-04 (×3): qty 1

## 2016-02-04 NOTE — Progress Notes (Signed)
Patient Demographics:    Mindy MenghiniHelen Van Snyder, is a 65 y.o. female, DOB - April 25, 1950, [id number]  Admit date - 02/02/2016   Admitting Physician Clydie Braunondell A Smith, MD  Outpatient Primary MD for the patient is PROVIDER NOT IN SYSTEM  LOS - 1   Chief Complaint  Patient presents with  . Abdominal Pain      Brief Hx- Pt presented with abd pain- LL abdomen, leukocytosis - 4.2. Ct abd- W contrast- Acute sigmoid diverticulitis. On Antibiotics- Cipro and flagyl.   Subjective:    [name] today has no fevers, no emesis,  No chest pain. Minimal abdomina pain, tolerating full liquid diet, she has no nausea, vomiting or diarrhea.   Assessment  & Plan :    Principal Problem:   Perforation of sigmoid colon due to diverticulitis Active Problems:   Essential hypertension   Hypokalemia   Leukocytosis  1)Sigmoid diverticulitis with microperforations but without Homero FellersFrank Abscess-Overall improving, no fevers or chills, no vomiting or diarrhea, white count is down to 4.2 from 12.6, no fevers,. On Cipro and Flagyl started on 02/02/2016. Leucocytosis resolved. On full liquid. Seen by surgical team- recs for low residue diet, also switch PO antibiotics.  - Advance diet as tolerated - Will switch to PO antibiotics tomorrow - Likely D/c 12/8  2)FEN/Hypothyroidism- replace potassium, diet as above in #1  3)HTN- hold HCTZ  (due to dehydration and hypokalemia), may restart HCTZ when patient is euvolemic, continue lisinopril,  may use IV Hydralazine 10 mg  Every 4 hours Prn for systolic blood pressure over 160 mmhg  Disposition Plan  : Likely tomorrow.  Consults  :  Surgery.  DVT Prophylaxis  :  Lovenox   Lab Results  Component Value Date   PLT 216 02/04/2016    Inpatient Medications  Scheduled Meds: . ciprofloxacin  400 mg Intravenous Q12H  . enoxaparin (LOVENOX) injection  40 mg Subcutaneous Q24H  .  lisinopril  20 mg Oral Daily  . metronidazole  500 mg Intravenous Q8H  . saccharomyces boulardii  250 mg Oral BID   Continuous Infusions: . sodium chloride 75 mL/hr at 02/04/16 0928   PRN Meds:.acetaminophen **OR** acetaminophen, hydrALAZINE, HYDROcodone-acetaminophen, HYDROmorphone (DILAUDID) injection, ondansetron **OR** ondansetron (ZOFRAN) IV    Anti-infectives    Start     Dose/Rate Route Frequency Ordered Stop   02/03/16 1400  ciprofloxacin (CIPRO) IVPB 400 mg     400 mg 200 mL/hr over 60 Minutes Intravenous Every 12 hours 02/03/16 0936 02/08/16 1359   02/03/16 0800  metroNIDAZOLE (FLAGYL) IVPB 500 mg     500 mg 100 mL/hr over 60 Minutes Intravenous Every 8 hours 02/02/16 2352     02/02/16 2300  ciprofloxacin (CIPRO) IVPB 400 mg     400 mg 200 mL/hr over 60 Minutes Intravenous  Once 02/02/16 2259 02/03/16 0120   02/02/16 2300  metroNIDAZOLE (FLAGYL) IVPB 500 mg     500 mg 100 mL/hr over 60 Minutes Intravenous  Once 02/02/16 2259 02/03/16 0020        Objective:   Vitals:   02/03/16 1432 02/03/16 2109 02/04/16 0620 02/04/16 0935  BP: (!) 100/57 (!) 113/57 (!) 110/54 132/65  Pulse: 75 73 63 71  Resp: 18 18 20    Temp:  97.9 F (36.6 C) 98.1 F (36.7 C) 98.8 F (37.1 C)   TempSrc: Oral Oral Oral   SpO2: 98% 97% 98%   Weight:      Height:        Wt Readings from Last 3 Encounters:  02/02/16 69.4 kg (153 lb)  03/24/11 67 kg (147 lb 11.3 oz)  03/16/11 66.7 kg (147 lb)     Intake/Output Summary (Last 24 hours) at 02/04/16 1256 Last data filed at 02/04/16 0730  Gross per 24 hour  Intake          2571.25 ml  Output                0 ml  Net          2571.25 ml     Physical Exam  Gen:- Awake Alert, oriented HEENT:- [place].AT, No sclera icterus. Neck-Supple Neck.  Lungs-  CTAB, no wheeze or crackles CV- S1, S2 normal, regular Abd-  +ve B.Sounds, Abd Soft,  tenderness,    Extremity/Skin:- No  edema, warm, well perfused.    Data Review:   Micro Results No  results found for this or any previous visit (from the past 240 hour(s)).  Radiology Reports Ct Abdomen Pelvis W Contrast  Result Date: 02/02/2016 CLINICAL DATA:  Initial evaluation for severe left lower quadrant abdominal pain. EXAM: CT ABDOMEN AND PELVIS WITH CONTRAST TECHNIQUE: Multidetector CT imaging of the abdomen and pelvis was performed using the standard protocol following bolus administration of intravenous contrast. CONTRAST:  100mL ISOVUE-300 IOPAMIDOL (ISOVUE-300) INJECTION 61% COMPARISON:  None available. FINDINGS: Lower chest: Mild atelectatic changes present within the visualized lung bases. The fat containing Bochdalek's type hernia present at the right lung base. Visualized lungs are otherwise clear. Hepatobiliary: 12 mm hypodensity within the central aspect of the liver noted, most consistent with a small cyst. Additional scattered sub cm hypodense lesions too small the characterize, but suspected to reflect additional small cysts. Liver is otherwise unremarkable. Gallbladder within normal limits. No biliary dilatation. Pancreas: Pancreas demonstrates no acute abnormality. Spleen: Spleen within normal limits. Adrenals/Urinary Tract: Indeterminate 18 mm nodule within the left adrenal gland. Adrenal is otherwise unremarkable. Kidneys equal in size with symmetric enhancement. Few subcentimeter hypodense lesions within the kidneys bilaterally too small the characterize. No nephrolithiasis or hydronephrosis. No focal enhancing renal mass. Ureters of normal caliber. Bladder grossly unremarkable, although evaluation limited by streak artifact from bilateral hip arthroplasties. Stomach/Bowel: Stomach within normal limits. Small bowel of normal caliber without evidence for obstruction or acute inflammation. Appendix normal. Extensive inflammatory stranding present about multiple diverticula within the distal descending/sigmoid colon, compatible with acute diverticulitis. A foci of gas within this  region are suspected to be extraluminal in location (series 5, image 51), which may reflect a small contained perforation and/or micro perforation. No discrete abscess. No associated obstruction. Vascular/Lymphatic: Normal intravascular enhancement seen throughout the intra-abdominal aorta and its branch vessels. Mild atherosclerotic disease within the aorta itself. No aneurysm. Mildly enlarged left periaortic lymph node measures up to 11 mm (series 2, image 42). This may be reactive. No other pathologically enlarged lymph nodes identified within the abdomen and pelvis. Reproductive: And ovaries grossly unremarkable. Other: No free fluid. Small fat containing paraumbilical hernia noted. Musculoskeletal: No acute osseous abnormality. 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 No worrisome lytic or blastic osseous lesions. IMPRESSION: 1. Findings consistent with acute sigmoid diverticulitis. Few small foci of gas within the area of  inflammation are suspected to be extraluminal in location, concerning for small contained perforation and/or micro perforation. No discrete abscess. No evidence for obstruction. 2. Mildly enlarged 11 mm left periaortic lymph node, suspected to be reactive. 3. 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 Critical Value/emergent results were called by telephone at the time of interpretation on 02/02/2016 at 10:55 pm to the physician assistant taking care of the patient Roxy Horseman , who verbally acknowledged these results. Electronically Signed   By: Rise Mu M.D.   On: 02/02/2016 22:58     CBC  Recent Labs Lab 02/02/16 1905 02/03/16 0521 02/04/16 0515  WBC 12.6* 7.7 4.2  HGB 13.8 11.9* 11.1*  HCT 41.4 35.8* 33.8*  PLT 245 217 216  MCV 96.1 94.2 97.1  MCH 32.0 31.3 31.9  MCHC 33.3 33.2 32.8  RDW 13.5 13.4 13.5    Chemistries   Recent Labs Lab  02/02/16 1905 02/03/16 0521 02/04/16 0515  NA 136 137 140  K 3.4* 4.2 3.7  CL 100* 102 109  CO2 26 29 25   GLUCOSE 105* 159* 102*  BUN 14 12 7   CREATININE 0.58 0.82 0.56  CALCIUM 9.4 8.7* 8.3*  AST 15  --   --   ALT 13*  --   --   ALKPHOS 86  --   --   BILITOT 0.8  --   --    ------------------------------------------------------------------------------------------------------------------ No results for input(s): CHOL, HDL, LDLCALC, TRIG, CHOLHDL, LDLDIRECT in the last 72 hours.  No results found for: HGBA1C ------------------------------------------------------------------------------------------------------------------ No results for input(s): TSH, T4TOTAL, T3FREE, THYROIDAB in the last 72 hours.  Invalid input(s): FREET3 ------------------------------------------------------------------------------------------------------------------ No results for input(s): VITAMINB12, FOLATE, FERRITIN, TIBC, IRON, RETICCTPCT in the last 72 hours.  Coagulation profile No results for input(s): INR, PROTIME in the last 168 hours.  No results for input(s): DDIMER in the last 72 hours.  Cardiac Enzymes No results for input(s): CKMB, TROPONINI, MYOGLOBIN in the last 168 hours.  Invalid input(s): CK ------------------------------------------------------------------------------------------------------------------ No results found for: BNP   Onnie Boer M.D on 02/04/2016 at 12:56 PM  Between 7am to 7pm - Pager - [phone]  After 7pm go to [email or web address] - password [email or web address]  [facility] -  Office  [phone]  Dragon dictation system was used to create this note, attempts have been made to correct errors, however presence of uncorrected errors is not a reflection quality of care provided

## 2016-02-04 NOTE — Progress Notes (Signed)
Patient ID: Mindy Snyder, female   DOB: 1950-05-01, 65 y.o.   MRN: [id number]  [facility]     Subjective: Feeling better today. Abdominal pain improving. Tolerating clears. Denies n/v. BM this morning.  Objective: Vital signs in last 24 hours: Temp:  [97.9 F (36.6 C)-98.8 F (37.1 C)] 98.8 F (37.1 C) (12/07 0620) Pulse Rate:  [63-75] 71 (12/07 0935) Resp:  [18-20] 20 (12/07 0620) BP: (100-132)/(54-65) 132/65 (12/07 0935) SpO2:  [97 %-98 %] 98 % (12/07 0620) Last BM Date: 02/02/16  Intake/Output from previous day: 12/06 0701 - 12/07 0700 In: 2571.3 [P.O.:240; I.V.:1631.3; IV Piggyback:700] Out: -  Intake/Output this shift: Total I/O In: 120 [P.O.:120] Out: -   PE: Gen:  Alert, NAD, pleasant Card:  RRR, no M/G/R heard Pulm:  CTAB, no W/R/R Abd: Soft, ND, +BS, mild LLQ/LUE tenderness  Lab Results:   Recent Labs  02/03/16 0521 02/04/16 0515  WBC 7.7 4.2  HGB 11.9* 11.1*  HCT 35.8* 33.8*  PLT 217 216   BMET  Recent Labs  02/03/16 0521 02/04/16 0515  NA 137 140  K 4.2 3.7  CL 102 109  CO2 29 25  GLUCOSE 159* 102*  BUN 12 7  CREATININE 0.82 0.56  CALCIUM 8.7* 8.3*   PT/INR No results for input(s): LABPROT, INR in the last 72 hours. CMP     Component Value Date/Time   NA 140 02/04/2016 0515   K 3.7 02/04/2016 0515   CL 109 02/04/2016 0515   CO2 25 02/04/2016 0515   GLUCOSE 102 (H) 02/04/2016 0515   BUN 7 02/04/2016 0515   CREATININE 0.56 02/04/2016 0515   CALCIUM 8.3 (L) 02/04/2016 0515   PROT 7.5 02/02/2016 1905   ALBUMIN 4.4 02/02/2016 1905   AST 15 02/02/2016 1905   ALT 13 (L) 02/02/2016 1905   ALKPHOS 86 02/02/2016 1905   BILITOT 0.8 02/02/2016 1905   GFRNONAA >60 02/04/2016 0515   GFRAA >60 02/04/2016 0515   Lipase     Component Value Date/Time   LIPASE 24 02/02/2016 1905       Studies/Results: Ct Abdomen Pelvis W Contrast  Result Date: 02/02/2016 CLINICAL DATA:  Initial evaluation for  severe left lower quadrant abdominal pain. EXAM: CT ABDOMEN AND PELVIS WITH CONTRAST TECHNIQUE: Multidetector CT imaging of the abdomen and pelvis was performed using the standard protocol following bolus administration of intravenous contrast. CONTRAST:  100mL ISOVUE-300 IOPAMIDOL (ISOVUE-300) INJECTION 61% COMPARISON:  None available. FINDINGS: Lower chest: Mild atelectatic changes present within the visualized lung bases. The fat containing Bochdalek's type hernia present at the right lung base. Visualized lungs are otherwise clear. Hepatobiliary: 12 mm hypodensity within the central aspect of the liver noted, most consistent with a small cyst. Additional scattered sub cm hypodense lesions too small the characterize, but suspected to reflect additional small cysts. Liver is otherwise unremarkable. Gallbladder within normal limits. No biliary dilatation. Pancreas: Pancreas demonstrates no acute abnormality. Spleen: Spleen within normal limits. Adrenals/Urinary Tract: Indeterminate 18 mm nodule within the left adrenal gland. Adrenal is otherwise unremarkable. Kidneys equal in size with symmetric enhancement. Few subcentimeter hypodense lesions within the kidneys bilaterally too small the characterize. No nephrolithiasis or hydronephrosis. No focal enhancing renal mass. Ureters of normal caliber. Bladder grossly unremarkable, although evaluation limited by streak artifact from bilateral hip arthroplasties. Stomach/Bowel: Stomach within normal limits. Small bowel of normal caliber without evidence for obstruction or acute inflammation. Appendix normal. Extensive inflammatory stranding present about multiple diverticula within  the distal descending/sigmoid colon, compatible with acute diverticulitis. A foci of gas within this region are suspected to be extraluminal in location (series 5, image 51), which may reflect a small contained perforation and/or micro perforation. No discrete abscess. No associated obstruction.  Vascular/Lymphatic: Normal intravascular enhancement seen throughout the intra-abdominal aorta and its branch vessels. Mild atherosclerotic disease within the aorta itself. No aneurysm. Mildly enlarged left periaortic lymph node measures up to 11 mm (series 2, image 42). This may be reactive. No other pathologically enlarged lymph nodes identified within the abdomen and pelvis. Reproductive: And ovaries grossly unremarkable. Other: No free fluid. Small fat containing paraumbilical hernia noted. Musculoskeletal: No acute osseous abnormality. 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 No worrisome lytic or blastic osseous lesions. IMPRESSION: 1. Findings consistent with acute sigmoid diverticulitis. Few small foci of gas within the area of inflammation are suspected to be extraluminal in location, concerning for small contained perforation and/or micro perforation. No discrete abscess. No evidence for obstruction. 2. Mildly enlarged 11 mm left periaortic lymph node, suspected to be reactive. 3. 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 Critical Value/emergent results were called by telephone at the time of interpretation on 02/02/2016 at 10:55 pm to the physician assistant taking care of the patient Roxy HorsemanROBERT BROWNING , who verbally acknowledged these results. Electronically Signed   By: Rise MuBenjamin  McClintock M.D.   On: 02/02/2016 22:58    Anti-infectives: Anti-infectives    Start     Dose/Rate Route Frequency Ordered Stop   02/03/16 1400  ciprofloxacin (CIPRO) IVPB 400 mg     400 mg 200 mL/hr over 60 Minutes Intravenous Every 12 hours 02/03/16 0936 02/08/16 1359   02/03/16 0800  metroNIDAZOLE (FLAGYL) IVPB 500 mg     500 mg 100 mL/hr over 60 Minutes Intravenous Every 8 hours 02/02/16 2352     02/02/16 2300  ciprofloxacin (CIPRO) IVPB 400 mg     400 mg 200 mL/hr over 60 Minutes Intravenous  Once  02/02/16 2259 02/03/16 0120   02/02/16 2300  metroNIDAZOLE (FLAGYL) IVPB 500 mg     500 mg 100 mL/hr over 60 Minutes Intravenous  Once 02/02/16 2259 02/03/16 0020       Assessment/Plan Sigmoid diverticulitis  - CT 12/5 showed acute sigmoid diverticulitis and few small foci of gas concerning for small contained perforation and/or micro perforation, no discrete abscess - WBC WNL, afebrile - abdominal exam improving - if she continues to improve would recommend OP follow-up with GI for colonoscopy in 6-8 weeks.  HTN   ID - cipro/flagyl 12/5>> FEN - full liquid diet VTE - lovenox  Plan - advance to full liquids. Continue IV anabiotics. Start probiotic. Check WBC in AM. If stable and doing well tomorrow could consider advancing diet and switching to PO antibiotics.  General surgery will sign off, please call with concerns.   LOS: 1 day    Edson SnowballBROOKE A MILLER , [facility] 02/04/2016, 11:16 AM Pager: [phone] Consults: [phone] Mon-Fri 7:00 am-4:30 pm Sat-Sun 7:00 am-11:30 am

## 2016-02-05 LAB — BASIC METABOLIC PANEL
Anion gap: 6 (ref 5–15)
BUN: 8 mg/dL (ref 6–20)
CHLORIDE: 111 mmol/L (ref 101–111)
CO2: 25 mmol/L (ref 22–32)
CREATININE: 0.64 mg/dL (ref 0.44–1.00)
Calcium: 9.3 mg/dL (ref 8.9–10.3)
GFR calc Af Amer: >60 mL/min (ref >60)
GFR calc non Af Amer: >60 mL/min (ref >60)
GLUCOSE: 97 mg/dL (ref 65–99)
POTASSIUM: 4.4 mmol/L (ref 3.5–5.1)
SODIUM: 142 mmol/L (ref 135–145)

## 2016-02-05 LAB — CBC
HCT: 34.3 % — ABNORMAL LOW (ref 36.0–46.0)
Hemoglobin: 11.4 g/dL — ABNORMAL LOW (ref 12.0–15.0)
MCH: 31.8 pg (ref 26.0–34.0)
MCHC: 33.2 g/dL (ref 30.0–36.0)
MCV: 95.8 fL (ref 78.0–100.0)
PLATELETS: 239 10*3/uL (ref 150–400)
RBC: 3.58 MIL/uL — AB (ref 3.87–5.11)
RDW: 13.1 % (ref 11.5–15.5)
WBC: 4.3 10*3/uL (ref 4.0–10.5)

## 2016-02-05 LAB — MAGNESIUM: MAGNESIUM: 2 mg/dL (ref 1.7–2.4)

## 2016-02-05 LAB — C DIFFICILE QUICK SCREEN W PCR REFLEX
C DIFFICLE (CDIFF) ANTIGEN: NEGATIVE
C Diff interpretation: NOT DETECTED
C Diff toxin: NEGATIVE

## 2016-02-05 MED ORDER — LOPERAMIDE HCL 2 MG PO CAPS: 2.0000 mg | ORAL_CAPSULE | ORAL | 0 refills | Status: DC | PRN

## 2016-02-05 MED ORDER — LOPERAMIDE HCL 2 MG PO CAPS: 2.0000 mg | ORAL_CAPSULE | Freq: Once | ORAL | Status: DC

## 2016-02-05 MED ORDER — CIPROFLOXACIN HCL 500 MG PO TABS: 500.0000 mg | ORAL_TABLET | Freq: Two times a day (BID) | ORAL | 0 refills | Status: AC

## 2016-02-05 MED ORDER — CIPROFLOXACIN HCL 500 MG PO TABS: 500.0000 mg | ORAL_TABLET | Freq: Two times a day (BID) | ORAL | 0 refills | Status: DC

## 2016-02-05 MED ORDER — METRONIDAZOLE 500 MG PO TABS
500.0000 mg | ORAL_TABLET | Freq: Three times a day (TID) | ORAL | 0 refills | Status: AC
Start: 2016-02-05 — End: 2016-02-13

## 2016-02-05 MED ORDER — METRONIDAZOLE 500 MG PO TABS
500.0000 mg | ORAL_TABLET | Freq: Three times a day (TID) | ORAL | Status: DC
  Administered 2016-02-05 (×2): 500 mg via ORAL
  Filled 2016-02-05 (×2): qty 1

## 2016-02-05 MED ORDER — CIPROFLOXACIN HCL 500 MG PO TABS
500.0000 mg | ORAL_TABLET | Freq: Two times a day (BID) | ORAL | Status: DC
  Administered 2016-02-05: 500 mg via ORAL
  Filled 2016-02-05: qty 1

## 2016-02-05 MED ORDER — METRONIDAZOLE 500 MG PO TABS: 500.0000 mg | ORAL_TABLET | Freq: Three times a day (TID) | ORAL | 0 refills | Status: DC

## 2016-02-05 MED ORDER — SACCHAROMYCES BOULARDII 250 MG PO CAPS: 250.0000 mg | ORAL_CAPSULE | Freq: Two times a day (BID) | ORAL | 0 refills | Status: DC

## 2016-02-05 NOTE — Discharge Summary (Addendum)
Mindy Snyder, is a 65 y.o. female  DOB 1951-01-15  MRN [id number].  Admission date:  02/02/2016  Admitting Physician  Clydie Braunondell A Smith, MD  Discharge Date:  02/05/2016   Primary MD  PROVIDER NOT IN SYSTEM  Recommendations for primary care physician for things to follow:   Follow up with PCP in about 2 weeks. Resolution of diarrhea. Completed course of antibiotics- 8 more days after discharge. Electrolytes Normal.   Admission Diagnosis  Diverticulitis of large intestine with perforation without abscess or bleeding [K57.20]   Discharge Diagnosis  Diverticulitis of large intestine with perforation without abscess or bleeding [K57.20]   Principal Problem:   Perforation of sigmoid colon due to diverticulitis Active Problems:   Essential hypertension   Hypokalemia   Leukocytosis      Past Medical History:  Diagnosis Date  . Arthritis    pain and oa left hip-plans hip replacement  -- s/p right hip replacement -doing well.  pt has arthritis pain lower back, ankles, wrists and "everywhere"  . Blood transfusion 2002   with previous hip replacement     Past Surgical History:  Procedure Laterality Date  . BREAST SURGERY     benign cyst removed from breast age 65  . JOINT REPLACEMENT  2002    right hip arthroplasty  . TOTAL HIP ARTHROPLASTY  03/23/2011   Procedure: TOTAL HIP ARTHROPLASTY;  Surgeon: Loanne DrillingFrank V Aluisio, MD;  Location: [facility];  Service: Orthopedics;  Laterality: Left;       HPI  from the history and physical done on the day of admission:   By Dr. Madelyn Flavorsondell Smith.   Chief Complaint: Lower abdominal pain  HPI: Mindy Snyder is a 65 y.o. female with medical history significant of HTN and arthritis; who presents with complaints of abdominal pain since yesterday evening. Patient notes acute onset of a dull pain across the left lower abdomen. She tried taking an antacid without  improvement of symptoms. Pain seemed somewhat worse with movement. She initially got symptoms may to self resolve on their own. She had had similar symptoms that self resolve 6 weeks ago that felt more so like pressure. However, symptoms persisted and seem to be worsening and she noted pain radiating upwards. She was unable to get a PCP office and therefore went to urgent care and was told to come to the emergency department for further evaluation. Patient notes that she's been able to eat and drink like normal. Associated symptoms include some chills. Denies any blood in stool/urinating, dysuria, vomiting, fever, headache, or recent sick contacts. Only recent change is that the patient was started on medication 6 weeks ago for essential hypertension.  ED Course: Upon admission to the emergency department patient was seen to have almost relatively within normal limits. Lab work revealed WBC 12.6, potassium 3.4, and all other lab work relatively within normal limits. CT imaging of the abdomen and pelvis revealed acute sigmoid diverticulitis with signs of perforation without abscess. Patient was started on antibiotics of ciprofloxacin,  Flagyl, and given IV Dilaudid for pain. Constitutional: Older female who appears to be in some discomfort especially when changing positions, but able to follow commands.      Vitals:   02/02/16 1843 02/02/16 1846 02/02/16 2104  BP: 124/70  127/72  Pulse: 92  87  Resp: 18  16  Temp: 98.4 F (36.9 C)  98.5 F (36.9 C)  TempSrc: Oral  Oral  SpO2: 100%  94%  Weight:  69.4 kg (153 lb)   Height:  5\' 5"  (1.651 m)    Eyes: PERRL, lids and conjunctivae normal ENMT: Mucous membranes are moist. Posterior pharynx clear of any exudate or lesions.Normal dentition.  Neck: normal, supple, no masses, no thyromegaly Respiratory: clear to auscultation bilaterally, no wheezing, no crackles. Normal respiratory effort. No accessory muscle use.  Cardiovascular: Regular  rate and rhythm, no murmurs / rubs / gallops. No extremity edema. 2+ pedal pulses. No carotid bruits.  Abdomen: Tenderness to palpation of the left lower abdominal quadrant, no masses palpated. No hepatosplenomegaly. Bowel sounds positive.  Musculoskeletal: no clubbing / cyanosis. No joint deformity upper and lower extremities. Good ROM, no contractures. Normal muscle tone.  Skin: no rashes, lesions, ulcers. No induration Neurologic: CN 2-12 grossly intact. Sensation intact, DTR normal. Strength 5/5 in all 4.  Psychiatric: Normal judgment and insight. Alert and oriented x 3. Normal mood.     Hospital Course:     Principal Problem:   Perforation of sigmoid colon due to diverticulitis Active Problems:   Essential hypertension   Hypokalemia   Leukocytosis  1) Diverticulitis of the sigmoid colon - with microperforations, seen on CT imaging with contrast, no frank abscess. No fevers , leukocytosis resolved. OnCipro and Flagyl started on 02/02/2016, Seen by surgical team-  no indication for surgery, conservative management signed off. Developed multiple episodes of watery stools night prior to discharge. C diff was negative. Diarrhea improved by evening and so pt was discharged, to complete 8 more days of antibiotics- cipro 500mg  BID and Flagyl 500mg  TID, also PRN imodium. On discharge pt was tolerating soft diet, lytes, creatinine were checked and were normal prior to discharge.    Called pharmacy to update change in duration of antibiotic and to let patient know about change, to complete total of 8 more days of antibiotics, not 5.   2)FEN-Repleted.  3)HTN-HCTZ held on admission while hydrating, lisinopril continued. To continue both medications on discharge.  Discharge Condition: Good.  Follow UP- PCP in 2 weeks.  Consults obtained - General Surgery.  Diet and Activity recommendation:  As advised  Discharge Instructions    Discharge Instructions    Diet - low sodium heart  healthy    Complete by:  As directed    Discharge instructions    Complete by:  As directed    I Have prescribed 2 antibiotics for you. Take Ciprofloxacin, 1 tablet two times a day, for 5 days, take metronidazole- 1 tablet three times a day for 5 days. If you have more loose stools, take one tablet after any loose stool. Please stay hydrated, drinks lots of fluids and eat.  Please follow up with your primary care doctor in about 2 weeks.   Increase activity slowly    Complete by:  As directed         Discharge Medications       Medication List    STOP taking these medications   amoxicillin 500 MG capsule Commonly known as:  AMOXIL   rivaroxaban 10 MG  Tabs tablet Commonly known as:  XARELTO     TAKE these medications   ciprofloxacin 500 MG tablet Commonly known as:  CIPRO Take 1 tablet (500 mg total) by mouth 2 (two) times daily. Start taking on:  02/06/2016   hydrochlorothiazide 25 MG tablet Commonly known as:  HYDRODIURIL Take 25 mg by mouth daily.   lisinopril 20 MG tablet Commonly known as:  PRINIVIL,ZESTRIL Take 20 mg by mouth daily.   loperamide 2 MG capsule Commonly known as:  IMODIUM Take 1 capsule (2 mg total) by mouth as needed for diarrhea or loose stools. One tablet after each loose stool.   metroNIDAZOLE 500 MG tablet Commonly known as:  FLAGYL Take 1 tablet (500 mg total) by mouth every 8 (eight) hours.   saccharomyces boulardii 250 MG capsule Commonly known as:  FLORASTOR Take 1 capsule (250 mg total) by mouth 2 (two) times daily.       Major procedures and Radiology Reports - PLEASE review detailed and final reports for all details.   -Ct Abdomen Pelvis W Contrast  Result Date: 02/02/2016 CLINICAL DATA:  Initial evaluation for severe left lower quadrant abdominal pain. EXAM: CT ABDOMEN AND PELVIS WITH CONTRAST TECHNIQUE: Multidetector CT imaging of the abdomen and pelvis was performed using the standard protocol following bolus administration  of intravenous contrast. CONTRAST:  ISOVUE-300 IOPAMIDOL (ISOVUE-300) INJECTION 61% COMPARISON:  None available. FINDINGS: Lower chest: Mild atelectatic changes present within the visualized lung bases. The fat containing Bochdalek's type hernia present at the right lung base. Visualized lungs are otherwise clear. Hepatobiliary: 12 mm hypodensity within the central aspect of the liver noted, most consistent with a small cyst. Additional scattered sub cm hypodense lesions too small the characterize, but suspected to reflect additional small cysts. Liver is otherwise unremarkable. Gallbladder within normal limits. No biliary dilatation. Pancreas: Pancreas demonstrates no acute abnormality. Spleen: Spleen within normal limits. Adrenals/Urinary Tract: Indeterminate 18 mm nodule within the left adrenal gland. Adrenal is otherwise unremarkable. Kidneys equal in size with symmetric enhancement. Few subcentimeter hypodense lesions within the kidneys bilaterally too small the characterize. No nephrolithiasis or hydronephrosis. No focal enhancing renal mass. Ureters of normal caliber. Bladder grossly unremarkable, although evaluation limited by streak artifact from bilateral hip arthroplasties. Stomach/Bowel: Stomach within normal limits. Small bowel of normal caliber without evidence for obstruction or acute inflammation. Appendix normal. Extensive inflammatory stranding present about multiple diverticula within the distal descending/sigmoid colon, compatible with acute diverticulitis. A foci of gas within this region are suspected to be extraluminal in location (series 5, image 51), which may reflect a small contained perforation and/or micro perforation. No discrete abscess. No associated obstruction. Vascular/Lymphatic: Normal intravascular enhancement seen throughout the intra-abdominal aorta and its branch vessels. Mild atherosclerotic disease within the aorta itself. No aneurysm. Mildly enlarged left periaortic  lymph node measures up to 11 mm (series 2, image 42). This may be reactive. No other pathologically enlarged lymph nodes identified within the abdomen and pelvis. Reproductive: And ovaries grossly unremarkable. Other: No free fluid. Small fat containing paraumbilical hernia noted. Musculoskeletal: No acute osseous abnormality. 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 No worrisome lytic or blastic osseous lesions. IMPRESSION: 1. Findings consistent with acute sigmoid diverticulitis. Few small foci of gas within the area of inflammation are suspected to be extraluminal in location, concerning for small contained perforation and/or micro perforation. No discrete abscess. No evidence for obstruction. 2. Mildly enlarged 11 mm left periaortic lymph  node, suspected to be reactive. 3. 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 Critical Value/emergent results were called by telephone at the time of interpretation on 02/02/2016 at 10:55 pm to the physician assistant taking care of the patient Roxy HorsemanROBERT BROWNING , who verbally acknowledged these results. Electronically Signed   By: Rise MuBenjamin  McClintock M.D.   On: 02/02/2016 22:58    Micro Results    Recent Results (from the past 240 hour(s))  C difficile quick scan w PCR reflex     Status: None   Collection Time: 02/05/16  9:50 AM  Result Value Ref Range Status   C Diff antigen NEGATIVE NEGATIVE Final   C Diff toxin NEGATIVE NEGATIVE Final   C Diff interpretation No C. difficile detected.  Final      Data Review   CBC w Diff:  Lab Results  Component Value Date   WBC 4.3 02/05/2016   HGB 11.4 (L) 02/05/2016   HCT 34.3 (L) 02/05/2016   PLT 239 02/05/2016    CMP:  Lab Results  Component Value Date   NA 142 02/05/2016   K 4.4 02/05/2016   CL 111 02/05/2016   CO2 25 02/05/2016   BUN 8 02/05/2016   CREATININE 0.64 02/05/2016   PROT 7.5  02/02/2016   ALBUMIN 4.4 02/02/2016   BILITOT 0.8 02/02/2016   ALKPHOS 86 02/02/2016   AST 15 02/02/2016   ALT 13 (L) 02/02/2016  .   Total Discharge time is about 33 minutes  Mindy Snyder M.D on 02/05/2016 at 7:44 PM  [facility]   Office  [phone]

## 2016-02-05 NOTE — Progress Notes (Signed)
Patient Demographics:    Mindy Snyder, is a 65 y.o. female, DOB - [birth year], ZOX:[id number]  Admit date - 02/02/2016   Admitting Physician Clydie Braunondell A Smith, MD  Outpatient Primary MD for the patient is PROVIDER NOT IN SYSTEM  LOS - 2   Chief Complaint  Patient presents with  . Abdominal Pain      Brief Hx- Pt presented with abd pain- LL abdomen, leukocytosis - 4.2. Ct abd- W contrast- Acute sigmoid diverticulitis. On Antibiotics- Cipro and flagyl.   Subjective:    Mindy Snyder complaints of having multiple episodes of watery diarrhea overnight until this morning. Non bloody. She has been taking full liquid diet and tolerating it without increased abdominal pain, no nausea or vomiting.   Assessment  & Plan :    Principal Problem:   Perforation of sigmoid colon due to diverticulitis Active Problems:   Essential hypertension   Hypokalemia   Leukocytosis  1) Diverticulitis of the sigmoid colon - with microperforations, seen on CT imaging with contrast, no frank abscess. No fevers , leukocytosis has resolved. Overall improving. On Cipro and Flagyl started on 02/02/2016. Tolerating full liquid- without abdominal pain nausea or vomiting. Seen by surgical team-  no indication for surgery, conservative management signed off. Developed multiple episodes of watery stools overnight until this morning. - With advanced diet stool softer diets tonight. - Switched to by mouth antibiotics Cipro and metronidazole today  -- C. Diff checked negative - Continue IV hydration at 75 mL an hour  - Continue probiotic florastor - Chek Bmet and Mag tonight, repeat if abnormal  - Likely D/c 12/9  2)FEN/Hypothyroidism-replace potassium,diet as above in #1  3)HTN- hold HCTZ (due to dehydration and hypokalemia), may restart HCTZ when patient is euvolemic, and hydrated . Continue lisinopril, may use IV Hydralazine  10 mg Every 4 hours Prn for systolic blood pressure over 160 mmhg  Disposition Plan  : Likely home tomorrow pending, resolution of diarrhea  Consults  :  Surgery.   DVT Prophylaxis  :  Lovenox  Lab Results  Component Value Date   PLT 239 02/05/2016    Inpatient Medications  Scheduled Meds: . ciprofloxacin  500 mg Oral BID  . enoxaparin (LOVENOX) injection  40 mg Subcutaneous Q24H  . lisinopril  20 mg Oral Daily  . metroNIDAZOLE  500 mg Oral Q8H  . saccharomyces boulardii  250 mg Oral BID   Continuous Infusions: . sodium chloride 75 mL/hr at 02/04/16 0928   PRN Meds:.acetaminophen **OR** acetaminophen, hydrALAZINE, HYDROcodone-acetaminophen, HYDROmorphone (DILAUDID) injection, ondansetron **OR** ondansetron (ZOFRAN) IV    Anti-infectives    Start     Dose/Rate Route Frequency Ordered Stop   02/05/16 1400  ciprofloxacin (CIPRO) tablet 500 mg     500 mg Oral 2 times daily 02/05/16 0751     02/05/16 0800  metroNIDAZOLE (FLAGYL) tablet 500 mg     500 mg Oral Every 8 hours 02/05/16 0751     02/03/16 1400  ciprofloxacin (CIPRO) IVPB 400 mg  Status:  Discontinued     400 mg 200 mL/hr over 60 Minutes Intravenous Every 12 hours 02/03/16 0936 02/05/16 0751   02/03/16 0800  metroNIDAZOLE (FLAGYL) IVPB 500 mg  Status:  Discontinued  500 mg 100 mL/hr over 60 Minutes Intravenous Every 8 hours 02/02/16 2352 02/05/16 0751   02/02/16 2300  ciprofloxacin (CIPRO) IVPB 400 mg     400 mg 200 mL/hr over 60 Minutes Intravenous  Once 02/02/16 2259 02/03/16 0120   02/02/16 2300  metroNIDAZOLE (FLAGYL) IVPB 500 mg     500 mg 100 mL/hr over 60 Minutes Intravenous  Once 02/02/16 2259 02/03/16 0020        Objective:   Vitals:   02/04/16 0935 02/04/16 1410 02/04/16 2114 02/05/16 0535  BP: 132/65 124/67 (!) 151/60 121/65  Pulse: 71 88 73 66  Resp:  20 20 19   Temp:  98.4 F (36.9 C) 99.1 F (37.3 C) 98.2 F (36.8 C)  TempSrc:  Oral Oral Oral  SpO2:  99% 98% 96%  Weight:        Height:        Wt Readings from Last 3 Encounters:  02/02/16 69.4 kg (153 lb)  03/24/11 67 kg (147 lb 11.3 oz)  03/16/11 66.7 kg (147 lb)     Intake/Output Summary (Last 24 hours) at 02/05/16 1325 Last data filed at 02/05/16 0600  Gross per 24 hour  Intake             2500 ml  Output                0 ml  Net             2500 ml     Physical Exam Gen:- Awake Alert, oriented, not in any distress. HEENT:- [place].AT, No sclera icterus. Neck-Supple Neck.  Lungs-  CTAB,equal air movement. CV- S1, S2 normal, regular Abd-  +ve B.Sounds, Abd Soft,  mild tenderness left lower and upper quadrant.    Extremity/Skin:- No  edema, warm, well perfused.   Data Review:   Micro Results Recent Results (from the past 240 hour(s))  C difficile quick scan w PCR reflex     Status: None   Collection Time: 02/05/16  9:50 AM  Result Value Ref Range Status   C Diff antigen NEGATIVE NEGATIVE Final   C Diff toxin NEGATIVE NEGATIVE Final   C Diff interpretation No C. difficile detected.  Final    Radiology Reports Ct Abdomen Pelvis W Contrast  Result Date: 02/02/2016 CLINICAL DATA:  Initial evaluation for severe left lower quadrant abdominal pain. EXAM: CT ABDOMEN AND PELVIS WITH CONTRAST TECHNIQUE:IMPRESSION: 1. Findings consistent with acute sigmoid diverticulitis. Few small foci of gas within the area of inflammation are suspected to be extraluminal in location, concerning for small contained perforation and/or micro perforation. No discrete abscess. No evidence for obstruction. 2. Mildly enlarged 11 mm left periaortic lymph node, suspected to be reactive. 3. 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 Critical Value/emergent results were called by telephone at the time of interpretation on 02/02/2016 at 10:55 pm to the physician assistant taking care of the patient Roxy Horseman , who verbally acknowledged these results. Electronically Signed   By: Rise Mu  M.D.   On: 02/02/2016 22:58   CBC  Recent Labs Lab 02/02/16 1905 02/03/16 0521 02/04/16 0515 02/05/16 0540  WBC 12.6* 7.7 4.2 4.3  HGB 13.8 11.9* 11.1* 11.4*  HCT 41.4 35.8* 33.8* 34.3*  PLT 245 217 216 239  MCV 96.1 94.2 97.1 95.8  MCH 32.0 31.3 31.9 31.8  MCHC 33.3 33.2 32.8 33.2  RDW 13.5 13.4 13.5 13.1    Chemistries   Recent Labs Lab  02/02/16 1905 02/03/16 0521 02/04/16 0515  NA 136 137 140  K 3.4* 4.2 3.7  CL 100* 102 109  CO2 26 29 25   GLUCOSE 105* 159* 102*  BUN 14 12 7   CREATININE 0.58 0.82 0.56  CALCIUM 9.4 8.7* 8.3*  AST 15  --   --   ALT 13*  --   --   ALKPHOS 86  --   --   BILITOT 0.8  --   --     Onnie BoerEjiroghene E Emokpae M.D on 02/05/2016 at 1:25 PM  Between 7am to 7pm - Pager - [phone]  After 7pm go to [email or web address] - password [email or web address]  [facility] -  Office  [phone]  Dragon dictation system was used to create this note, attempts have been made to correct errors, however presence of uncorrected errors is not a reflection quality of care provided.

## 2016-02-05 NOTE — Progress Notes (Signed)
Went over AVS instructions with patient.  She left hospital via personal vehicle.

## 2016-07-27 ENCOUNTER — Ambulatory Visit (INDEPENDENT_AMBULATORY_CARE_PROVIDER_SITE_OTHER): Payer: Federal, State, Local not specified - PPO | Admitting: Interventional Cardiology

## 2016-07-27 ENCOUNTER — Encounter (INDEPENDENT_AMBULATORY_CARE_PROVIDER_SITE_OTHER): Payer: Self-pay

## 2016-07-27 ENCOUNTER — Encounter: Payer: Self-pay | Admitting: Interventional Cardiology

## 2016-07-27 VITALS — BP 108/66 | HR 82 | Ht 65.0 in | Wt 153.2 lb

## 2016-07-27 DIAGNOSIS — R55 Syncope and collapse: Secondary | ICD-10-CM | POA: Diagnosis not present

## 2016-07-27 DIAGNOSIS — I10 Essential (primary) hypertension: Secondary | ICD-10-CM

## 2016-07-27 NOTE — Patient Instructions (Signed)
Medication Instructions:  Your physician has recommended you make the following change in your medication:   STOP Hydrochlorothiazide    Labwork: None ordered  Testing/Procedures: None ordered  Follow-Up: Your physician recommends that you schedule a follow-up appointment in: 4 months with Dr. Eldridge DaceVaranasi    Any Other Special Instructions Will Be Listed Below (If Applicable). Make sure that you stay well hydrated. Limit the amount of your alcohol intake. Make sure to change positions slowly.    If you need a refill on your cardiac medications before your next appointment, please call your pharmacy.

## 2016-07-27 NOTE — Progress Notes (Signed)
Cardiology Office Note   Date:  07/27/2016   ID:  Mindy Snyder, DOB 08/31/1950, MRN [id number]  PCP:  Iva BoopVia, Kevin, MD    Chief Complaint  Patient presents with  . New Patient (Initial Visit)  syncope   Wt Readings from Last 3 Encounters:  07/27/16 153 lb 3.2 oz (69.5 kg)  02/02/16 153 lb (69.4 kg)  03/24/11 147 lb 11.3 oz (67 kg)       History of Present Illness: Mindy Snyder is a 66 y.o. female who is being seen today for the evaluation of syncope at the request of Dr. Caryn BeeKevin Via.  A few days a go, she was sitting at the dinner table.  She felt a dizzy feeling and the next thing she knows, she was on the floor.  She woke up immediately- she was out for a few secinds.  She was able to talk.  She had a mild headache.  No palpitations.  She has occasional lightheadedness.  She had not been drinking a lot of water.  She had  A few cocktails which is usual for her.    A few weeks earlier, she had some dizziness with standing at the sink.  No syncope at that time.    She does not walk regularly. When she does, no problems.   Denies : Chest pain.  Leg edema. Orthopnea. Palpitations. Paroxysmal nocturnal dyspnea. Shortness of breath. Syncope.   She does report some lightheadedness with standing.  BP is usually well controlled.  After syncope, HCTZ was decreased in half.      Past Medical History:  Diagnosis Date  . Arthritis    pain and oa left hip-plans hip replacement  -- s/p right hip replacement -doing well.  pt has arthritis pain lower back, ankles, wrists and "everywhere"  . Blood transfusion 2002   with previous hip replacement     Past Surgical History:  Procedure Laterality Date  . BREAST SURGERY     benign cyst removed from breast age 66  . JOINT REPLACEMENT  2002    right hip arthroplasty  . TOTAL HIP ARTHROPLASTY  03/23/2011   Procedure: TOTAL HIP ARTHROPLASTY;  Surgeon: Loanne DrillingFrank V Aluisio, MD;  Location: [facility];  Service: Orthopedics;   Laterality: Left;     Current Outpatient Prescriptions  Medication Sig Dispense Refill  . hydrochlorothiazide (HYDRODIURIL) 12.5 MG tablet Take 12.5 mg by mouth daily.    Marland Kitchen. ibuprofen (ADVIL,MOTRIN) 200 MG tablet Take 200 mg by mouth daily as needed (pain).    Marland Kitchen. lisinopril (PRINIVIL,ZESTRIL) 20 MG tablet Take 20 mg by mouth daily.  0   No current facility-administered medications for this visit.     Allergies:   Codeine    Social History:  The patient  reports that she has quit smoking. Her smoking use included Cigarettes. She has a 25.00 pack-year smoking history. She has never used smokeless tobacco. She reports that she drinks alcohol. She reports that she does not use drugs.   Family History:  The patient's family history includes Diabetes in her father; Hyperlipidemia in her father and mother; Hypertension in her father and mother.    ROS:  Please see the history of present illness.   Otherwise, review of systems are positive for syncope.   All other systems are reviewed and negative.    PHYSICAL EXAM: VS:  BP 108/66   Pulse 82   Ht 5\' 5"  (1.651 m)   Wt 153 lb 3.2  oz (69.5 kg)   BMI 25.49 kg/m  , BMI Body mass index is 25.49 kg/m. GEN: Well nourished, well developed, in no acute distress  HEENT: normal  Neck: no JVD, carotid bruits, or masses Cardiac: RRR; no murmurs, rubs, or gallops,no edema  Respiratory:  clear to auscultation bilaterally, normal work of breathing GI: soft, nontender, nondistended, + BS MS: no deformity or atrophy  Skin: warm and dry, no rash Neuro:  Strength and sensation are intact Psych: euthymic mood, full affect   EKG:   The ekg ordered 07/26/16 demonstrates NSR, no ST segment changes   Recent Labs: 02/02/2016: ALT 13 02/05/2016: BUN 8; Creatinine, Ser 0.64; Hemoglobin 11.4; Magnesium 2.0; Platelets 239; Potassium 4.4; Sodium 142   Lipid Panel No results found for: CHOL, TRIG, HDL, CHOLHDL, VLDL, LDLCALC, LDLDIRECT   Other studies  Reviewed: Additional studies/ records that were reviewed today with results demonstrating: Admission for diverticulitis in 12/17.   ASSESSMENT AND PLAN:  1. Syncope: Likely related to a host of factors including not drinking enough water, multiple blood pressure medications and alcohol use. She did have a warning that she was going to pass out. No evidence of conduction disease on the ECG. We will stop her HCTZ. I've asked her to limit alcohol intake. I've asked her to stay well hydrated. No symptoms or signs of arrhythmia. We'll hold off on any type of monitor. Cardiac exam is normal. She needs to be careful when changing positions as well. Will try these lifestyle changes and see if she has any further symptoms. 2. Hypertension: She'll be off the diuretic. Continue lisinopril. If her blood pressure remain low, could decrease lisinopril in half. 3. Increase exercise to 30 minutes a day, 5 days a week, of some walking.   Current medicines are reviewed at length with the patient today.  The patient concerns regarding her medicines were addressed.  The following changes have been made:  Stop HCTZ  Labs/ tests ordered today include:  No orders of the defined types were placed in this encounter.   Recommend 150 minutes/week of aerobic exercise Low fat, low carb, high fiber diet recommended  Disposition:   FU in 4 months   Signed, Lance Muss, MD  07/27/2016 10:12 AM    [facility] [address] [place], [place], [place]  [address] Phone: [phone]; Fax: [phone]

## 2016-07-29 DIAGNOSIS — Z87898 Personal history of other specified conditions: Secondary | ICD-10-CM

## 2016-07-29 HISTORY — DX: Personal history of other specified conditions: Z87.898

## 2016-11-23 NOTE — Progress Notes (Signed)
Cardiology Office Note   Date:  11/24/2016   ID:  Mindy Snyder, DOB 12-28-1950, MRN [id number]  PCP:  Iva Boop, MD    No chief complaint on file. syncope   Wt Readings from Last 3 Encounters:  11/24/16 153 lb 3.2 oz (69.5 kg)  07/27/16 153 lb 3.2 oz (69.5 kg)  02/02/16 153 lb (69.4 kg)       History of Present Illness: Mindy Snyder is a 66 y.o. female  Who was seen for syncope in 6/18.  It was thought that syncope was likely related to a host of factors including not drinking enough water, multiple blood pressure medications and alcohol use. She did have a warning that she was going to pass out. No evidence of conduction disease on the ECG at the time of her initial w/u in the ER. I asked her to limit alcohol intake. I asked her to stay well hydrated. No symptoms or signs of arrhythmia. We did not pursue a monitor. Cardiac exam is normal. HCTZ was stopped.  She needs to be careful when changing positions as well. Will try these lifestyle changes and see if she has any further symptoms.  Since the last visit, she has not had any syncope or lightheadedness.  She is exercising regularly with walking and swimming.  Denies : Chest pain. Dizziness. Leg edema. Nitroglycerin use. Orthopnea. Palpitations. Paroxysmal nocturnal dyspnea. Shortness of breath. Syncope.   BP has ben well controlled.    Past Medical History:  Diagnosis Date  . Arthritis    pain and oa left hip-plans hip replacement  -- s/p right hip replacement -doing well.  pt has arthritis pain lower back, ankles, wrists and "everywhere"  . Blood transfusion 2002   with previous hip replacement     Past Surgical History:  Procedure Laterality Date  . BREAST SURGERY     benign cyst removed from breast age 31  . JOINT REPLACEMENT  2002    right hip arthroplasty  . TOTAL HIP ARTHROPLASTY  03/23/2011   Procedure: TOTAL HIP ARTHROPLASTY;  Surgeon: Loanne Drilling, MD;  Location: [facility];  Service:  Orthopedics;  Laterality: Left;     Current Outpatient Prescriptions  Medication Sig Dispense Refill  . atorvastatin (LIPITOR) 40 MG tablet Take 40 mg by mouth daily.  3  . ibuprofen (ADVIL,MOTRIN) 200 MG tablet Take 200 mg by mouth daily as needed (pain).    Marland Kitchen lisinopril (PRINIVIL,ZESTRIL) 20 MG tablet Take 20 mg by mouth daily.  0   No current facility-administered medications for this visit.     Allergies:   Codeine    Social History:  The patient  reports that she has quit smoking. Her smoking use included Cigarettes. She has a 25.00 pack-year smoking history. She has never used smokeless tobacco. She reports that she drinks alcohol. She reports that she does not use drugs.   Family History:  The patient's family history includes Diabetes in her father; Hyperlipidemia in her father and mother; Hypertension in her father and mother.    ROS:  Please see the history of present illness.   Otherwise, review of systems are positive for eating healthier to try to get cholesterol down.   All other systems are reviewed and negative.    PHYSICAL EXAM: VS:  BP 116/70   Pulse 89   Ht  (1.626 m)   Wt 153 lb 3.2 oz (69.5 kg)   SpO2 96%   BMI 26.30  kg/m  , BMI Body mass index is 26.3 kg/m. GEN: Well nourished, well developed, in no acute distress  HEENT: normal  Neck: no JVD, carotid bruits, or masses Cardiac: RRR; no murmurs, rubs, or gallops,no edema  Respiratory:  clear to auscultation bilaterally, normal work of breathing GI: soft, nontender, nondistended, + BS MS: no deformity or atrophy  Skin: warm and dry, no rash Neuro:  Strength and sensation are intact Psych: euthymic mood, full affect     Recent Labs: 02/02/2016: ALT 13 02/05/2016: BUN 8; Creatinine, Ser 0.64; Hemoglobin 11.4; Magnesium 2.0; Platelets 239; Potassium 4.4; Sodium 142   Lipid Panel No results found for: CHOL, TRIG, HDL, CHOLHDL, VLDL, LDLCALC, LDLDIRECT   Other studies Reviewed: Additional  studies/ records that were reviewed today with results demonstrating: .   ASSESSMENT AND PLAN:  1. Syncope: No further syncope.  Staying well hydrated has helped.  She continues to drink 1-2 glasses of alcohol daily.  I stressed the importance of replacing losses due to alcohol, sweating during exercise, etc with water.  2. HTN: Now off HCTZ.  BP controlled.  Continue to hold diuretic.     Current medicines are reviewed at length with the patient today.  The patient concerns regarding her medicines were addressed.  The following changes have been made:  No change  Labs/ tests ordered today include:  No orders of the defined types were placed in this encounter.   Recommend 150 minutes/week of aerobic exercise Low fat, low carb, high fiber diet recommended  Disposition:   FU prn   Signed, Lance Muss, MD  11/24/2016 11:46 AM    [facility] [address] [place], [place], [place]  [address] Phone: [phone]; Fax: [phone]

## 2016-11-24 ENCOUNTER — Ambulatory Visit (INDEPENDENT_AMBULATORY_CARE_PROVIDER_SITE_OTHER): Payer: Federal, State, Local not specified - PPO | Admitting: Interventional Cardiology

## 2016-11-24 ENCOUNTER — Encounter: Payer: Self-pay | Admitting: Interventional Cardiology

## 2016-11-24 VITALS — BP 116/70 | HR 89 | Ht 64.0 in | Wt 153.2 lb

## 2016-11-24 DIAGNOSIS — R55 Syncope and collapse: Secondary | ICD-10-CM | POA: Diagnosis not present

## 2016-11-24 DIAGNOSIS — I10 Essential (primary) hypertension: Secondary | ICD-10-CM

## 2016-11-24 NOTE — Patient Instructions (Addendum)
Medication Instructions:  Your physician recommends that you continue on your current medications as directed. Please refer to the Current Medication list given to you today.   Labwork: None ordered  Testing/Procedures: None ordered  Follow-Up: Your physician wants you to follow-up AS NEEDED if symptoms worsen or fail to improve   Any Other Special Instructions Will Be Listed Below (If Applicable).     If you need a refill on your cardiac medications before your next appointment, please call your pharmacy.   

## 2017-06-28 ENCOUNTER — Other Ambulatory Visit (HOSPITAL_COMMUNITY): Payer: Self-pay | Admitting: Physician Assistant

## 2017-06-28 DIAGNOSIS — M25551 Pain in right hip: Secondary | ICD-10-CM

## 2017-07-11 ENCOUNTER — Encounter (HOSPITAL_COMMUNITY)
Admission: RE | Admit: 2017-07-11 | Discharge: 2017-07-11 | Disposition: A | Discharge status: Home / Self Care | Payer: Federal, State, Local not specified - PPO | Source: Ambulatory Visit | Attending: Physician Assistant | Admitting: Physician Assistant

## 2017-07-11 DIAGNOSIS — M25551 Pain in right hip: Secondary | ICD-10-CM | POA: Diagnosis not present

## 2017-07-11 MED ORDER — TECHNETIUM TC 99M MEDRONATE IV KIT
20.9000 | PACK | Freq: Once | INTRAVENOUS | Status: AC | PRN
  Administered 2017-07-11: 20.9 via INTRAVENOUS

## 2017-08-24 ENCOUNTER — Other Ambulatory Visit: Payer: Self-pay

## 2017-08-24 ENCOUNTER — Encounter (HOSPITAL_COMMUNITY): Payer: Self-pay

## 2017-08-24 ENCOUNTER — Emergency Department (HOSPITAL_COMMUNITY): Payer: Federal, State, Local not specified - PPO

## 2017-08-24 ENCOUNTER — Observation Stay (HOSPITAL_COMMUNITY)
Admission: EM | Admit: 2017-08-24 | Discharge: 2017-08-25 | Disposition: A | Discharge status: Home / Self Care | Payer: Federal, State, Local not specified - PPO | Attending: Internal Medicine | Admitting: Internal Medicine

## 2017-08-24 DIAGNOSIS — Z7289 Other problems related to lifestyle: Secondary | ICD-10-CM | POA: Insufficient documentation

## 2017-08-24 DIAGNOSIS — Z87891 Personal history of nicotine dependence: Secondary | ICD-10-CM | POA: Diagnosis not present

## 2017-08-24 DIAGNOSIS — Z8249 Family history of ischemic heart disease and other diseases of the circulatory system: Secondary | ICD-10-CM | POA: Diagnosis not present

## 2017-08-24 DIAGNOSIS — R112 Nausea with vomiting, unspecified: Secondary | ICD-10-CM | POA: Insufficient documentation

## 2017-08-24 DIAGNOSIS — G3189 Other specified degenerative diseases of nervous system: Secondary | ICD-10-CM | POA: Insufficient documentation

## 2017-08-24 DIAGNOSIS — Z79899 Other long term (current) drug therapy: Secondary | ICD-10-CM | POA: Diagnosis not present

## 2017-08-24 DIAGNOSIS — Z885 Allergy status to narcotic agent status: Secondary | ICD-10-CM | POA: Diagnosis not present

## 2017-08-24 DIAGNOSIS — H532 Diplopia: Secondary | ICD-10-CM | POA: Insufficient documentation

## 2017-08-24 DIAGNOSIS — I10 Essential (primary) hypertension: Secondary | ICD-10-CM | POA: Insufficient documentation

## 2017-08-24 DIAGNOSIS — Z96643 Presence of artificial hip joint, bilateral: Secondary | ICD-10-CM | POA: Diagnosis not present

## 2017-08-24 DIAGNOSIS — E785 Hyperlipidemia, unspecified: Secondary | ICD-10-CM | POA: Diagnosis not present

## 2017-08-24 DIAGNOSIS — R42 Dizziness and giddiness: Secondary | ICD-10-CM | POA: Diagnosis not present

## 2017-08-24 DIAGNOSIS — R51 Headache: Secondary | ICD-10-CM | POA: Insufficient documentation

## 2017-08-24 LAB — BASIC METABOLIC PANEL
ANION GAP: 7 (ref 5–15)
BUN: 17 mg/dL (ref 8–23)
CALCIUM: 8.8 mg/dL — AB (ref 8.9–10.3)
CHLORIDE: 113 mmol/L — AB (ref 98–111)
CO2: 23 mmol/L (ref 22–32)
CREATININE: 0.62 mg/dL (ref 0.44–1.00)
GFR calc Af Amer: >60 mL/min (ref >60)
GFR calc non Af Amer: >60 mL/min (ref >60)
Glucose, Bld: 150 mg/dL — ABNORMAL HIGH (ref 70–99)
Potassium: 3.7 mmol/L (ref 3.5–5.1)
SODIUM: 143 mmol/L (ref 135–145)

## 2017-08-24 LAB — CBC
HCT: 41.2 % (ref 36.0–46.0)
HEMOGLOBIN: 13.2 g/dL (ref 12.0–15.0)
MCH: 31.6 pg (ref 26.0–34.0)
MCHC: 32 g/dL (ref 30.0–36.0)
MCV: 98.6 fL (ref 78.0–100.0)
PLATELETS: 209 10*3/uL (ref 150–400)
RBC: 4.18 MIL/uL (ref 3.87–5.11)
RDW: 13.4 % (ref 11.5–15.5)
WBC: 5.6 10*3/uL (ref 4.0–10.5)

## 2017-08-24 LAB — LIPASE, BLOOD: Lipase: 28 U/L (ref 11–51)

## 2017-08-24 LAB — TSH: TSH: 0.437 u[IU]/mL (ref 0.350–4.500)

## 2017-08-24 LAB — HEPATIC FUNCTION PANEL
ALT: 15 U/L (ref 0–44)
AST: 21 U/L (ref 15–41)
Albumin: 3.8 g/dL (ref 3.5–5.0)
Alkaline Phosphatase: 70 U/L (ref 38–126)
BILIRUBIN DIRECT: 0.1 mg/dL (ref 0.0–0.2)
Indirect Bilirubin: 0.4 mg/dL (ref 0.3–0.9)
TOTAL PROTEIN: 6.4 g/dL — AB (ref 6.5–8.1)
Total Bilirubin: 0.5 mg/dL (ref 0.3–1.2)

## 2017-08-24 LAB — VITAMIN B12: Vitamin B-12: 190 pg/mL (ref 180–914)

## 2017-08-24 LAB — I-STAT TROPONIN, ED: Troponin i, poc: 0 ng/mL (ref 0.00–0.08)

## 2017-08-24 LAB — CBG MONITORING, ED: GLUCOSE-CAPILLARY: 121 mg/dL — AB (ref 70–99)

## 2017-08-24 MED ORDER — IBUPROFEN 200 MG PO TABS
200.0000 mg | ORAL_TABLET | Freq: Every day | ORAL | Status: DC | PRN
Start: 2017-08-24 — End: 2017-08-25

## 2017-08-24 MED ORDER — SODIUM CHLORIDE 0.9 % IV SOLN: INTRAVENOUS | Status: AC

## 2017-08-24 MED ORDER — SODIUM CHLORIDE 0.9 % IV SOLN
Freq: Once | INTRAVENOUS | Status: AC
  Administered 2017-08-24: 11:00:00 via INTRAVENOUS

## 2017-08-24 MED ORDER — ONDANSETRON HCL 4 MG/2ML IJ SOLN
4.0000 mg | Freq: Once | INTRAMUSCULAR | Status: AC
  Administered 2017-08-24: 4 mg via INTRAVENOUS
  Filled 2017-08-24: qty 2

## 2017-08-24 MED ORDER — MECLIZINE HCL 25 MG PO TABS
25.0000 mg | ORAL_TABLET | Freq: Three times a day (TID) | ORAL | Status: DC
  Administered 2017-08-24 – 2017-08-25 (×3): 25 mg via ORAL
  Filled 2017-08-24 (×3): qty 1

## 2017-08-24 MED ORDER — FOLIC ACID 1 MG PO TABS
1.0000 mg | ORAL_TABLET | Freq: Every day | ORAL | Status: DC
  Administered 2017-08-24 – 2017-08-25 (×2): 1 mg via ORAL
  Filled 2017-08-24 (×2): qty 1

## 2017-08-24 MED ORDER — LISINOPRIL 20 MG PO TABS
20.0000 mg | ORAL_TABLET | Freq: Every day | ORAL | Status: DC
  Administered 2017-08-24 – 2017-08-25 (×2): 20 mg via ORAL
  Filled 2017-08-24 (×2): qty 1

## 2017-08-24 MED ORDER — ACETAMINOPHEN 650 MG RE SUPP: 650.0000 mg | Freq: Four times a day (QID) | RECTAL | Status: DC | PRN

## 2017-08-24 MED ORDER — PREDNISONE 20 MG PO TABS
40.0000 mg | ORAL_TABLET | Freq: Once | ORAL | Status: AC
  Administered 2017-08-24: 40 mg via ORAL
  Filled 2017-08-24: qty 2

## 2017-08-24 MED ORDER — LISINOPRIL 20 MG PO TABS
20.0000 mg | ORAL_TABLET | Freq: Once | ORAL | Status: AC
  Administered 2017-08-24: 20 mg via ORAL
  Filled 2017-08-24: qty 1

## 2017-08-24 MED ORDER — ADULT MULTIVITAMIN W/MINERALS CH
1.0000 | ORAL_TABLET | Freq: Every day | ORAL | Status: DC
  Administered 2017-08-24 – 2017-08-25 (×2): 1 via ORAL
  Filled 2017-08-24 (×2): qty 1

## 2017-08-24 MED ORDER — DIAZEPAM 5 MG PO TABS: 5.0000 mg | ORAL_TABLET | Freq: Once | ORAL | Status: DC

## 2017-08-24 MED ORDER — BISACODYL 5 MG PO TBEC: 5.0000 mg | DELAYED_RELEASE_TABLET | Freq: Every day | ORAL | Status: DC | PRN

## 2017-08-24 MED ORDER — ATORVASTATIN CALCIUM 40 MG PO TABS
40.0000 mg | ORAL_TABLET | Freq: Every day | ORAL | Status: DC
  Administered 2017-08-24 – 2017-08-25 (×2): 40 mg via ORAL
  Filled 2017-08-24 (×2): qty 1

## 2017-08-24 MED ORDER — VITAMIN B-1 100 MG PO TABS
100.0000 mg | ORAL_TABLET | Freq: Every day | ORAL | Status: DC
  Administered 2017-08-24 – 2017-08-25 (×2): 100 mg via ORAL
  Filled 2017-08-24 (×2): qty 1

## 2017-08-24 MED ORDER — LORAZEPAM 2 MG/ML IJ SOLN: 1.0000 mg | Freq: Four times a day (QID) | INTRAMUSCULAR | Status: DC | PRN

## 2017-08-24 MED ORDER — LORAZEPAM 2 MG/ML IJ SOLN
1.0000 mg | Freq: Once | INTRAMUSCULAR | Status: AC
  Administered 2017-08-24: 1 mg via INTRAVENOUS
  Filled 2017-08-24: qty 1

## 2017-08-24 MED ORDER — LORAZEPAM 1 MG PO TABS: 1.0000 mg | ORAL_TABLET | Freq: Four times a day (QID) | ORAL | Status: DC | PRN

## 2017-08-24 MED ORDER — ACETAMINOPHEN 325 MG PO TABS: 650.0000 mg | ORAL_TABLET | Freq: Four times a day (QID) | ORAL | Status: DC | PRN

## 2017-08-24 MED ORDER — MECLIZINE HCL 25 MG PO TABS
25.0000 mg | ORAL_TABLET | Freq: Once | ORAL | Status: AC
  Administered 2017-08-24: 25 mg via ORAL
  Filled 2017-08-24: qty 1

## 2017-08-24 MED ORDER — SENNOSIDES-DOCUSATE SODIUM 8.6-50 MG PO TABS: 1.0000 | ORAL_TABLET | Freq: Every evening | ORAL | Status: DC | PRN

## 2017-08-24 MED ORDER — THIAMINE HCL 100 MG/ML IJ SOLN: 100.0000 mg | Freq: Every day | INTRAMUSCULAR | Status: DC

## 2017-08-24 NOTE — H&P (Signed)
History and Physical    Mindy Snyder ZOX:[id number] DOB: 1951/01/18 DOA: 08/24/2017  PCP: Iva Boop, MD Patient coming from: Home Chief Complaint: Dizziness  HPI: Mindy Snyder is a 67 y.o. female with medical history significant of essential hypertension, hyperlipidemia came to the hospital for evaluation of dizziness.  Patient states she was feeling well up until this morning and then around 10 AM she started feeling very dizzy, describes as room spinning around with feeling of nausea and couple of episodes of nonbloody vomiting.  Since then she has also had little generalized headache and off and on had diplopia.  Denied any blurry vision, slurred speech, any other focal neuro deficits, tinnitus, recent URI, fever, chills, medications changes, chest pain, shortness of breath, abdominal pain.  Per daughter who is an occupational therapist is also at bedside stated she had similar episode about 7 years ago and was treated with Epley maneuver but this time its more severe therefore came to the ER.  In the ER patient's labs are unremarkable.  Her vital signs are stable but she continues to have symptoms of dizziness with an episode of nonbloody vomiting.  CT of the head and MRI of the brain is negative for acute pathology, shows chronic small blood vessel changes.  During the examination by the ER provider she had rightward beating nystagmus bilaterally and horizontally. Despite of getting prednisone, Ativan and meclizine her symptoms persisted.  She was also given a liter of normal saline bolus.  Due to this it was determined to admit her for observation and management.   Review of Systems: As per HPI otherwise 10 point review of systems negative.  Review of Systems Otherwise negative except as per HPI, including: General: Denies fever, chills, night sweats or unintended weight loss. Resp: Denies cough, wheezing, shortness of breath. Cardiac: Denies chest pain, palpitations, orthopnea,  paroxysmal nocturnal dyspnea. GI: Denies abdominal pain, nausea, vomiting, diarrhea or constipation GU: Denies dysuria, frequency, hesitancy or incontinence MS: Denies muscle aches, joint pain or swelling Neuro: Denies headache, neurologic deficits (focal weakness, numbness, tingling), abnormal gait Psych: Denies anxiety, depression, SI/HI/AVH Skin: Denies new rashes or lesions ID: Denies sick contacts, exotic exposures, travel  Past Medical History:  Diagnosis Date  . Arthritis    pain and oa left hip-plans hip replacement  -- s/p right hip replacement -doing well.  pt has arthritis pain lower back, ankles, wrists and "everywhere"  . Blood transfusion 2002   with previous hip replacement     Past Surgical History:  Procedure Laterality Date  . BREAST SURGERY     benign cyst removed from breast age 46  . JOINT REPLACEMENT  2002    right hip arthroplasty  . TOTAL HIP ARTHROPLASTY  03/23/2011   Procedure: TOTAL HIP ARTHROPLASTY;  Surgeon: Loanne Drilling, MD;  Location: [facility];  Service: Orthopedics;  Laterality: Left;    SOCIAL HISTORY:  reports that she has quit smoking. Her smoking use included cigarettes. She has a 25.00 pack-year smoking history. She has never used smokeless tobacco. She reports that she drinks alcohol. She reports that she does not use drugs.  Allergies  Allergen Reactions  . Codeine Other (See Comments) and Nausea Only    Upset Stomach    FAMILY HISTORY: Family History  Problem Relation Age of Onset  . Hypertension Mother   . Hyperlipidemia Mother   . Hypertension Father   . Diabetes Father   . Hyperlipidemia Father      Prior  to Admission medications   Medication Sig Start Date End Date Taking? Authorizing Provider  atorvastatin (LIPITOR) 40 MG tablet Take 40 mg by mouth daily. 11/10/16  Yes [provider]  ibuprofen (ADVIL,MOTRIN) 200 MG tablet Take 200 mg by mouth daily as needed for mild pain or moderate pain (pain).    Yes [provider]  lisinopril (PRINIVIL,ZESTRIL) 20 MG tablet Take 20 mg by mouth daily. 01/07/16  Yes [provider]    Physical Exam: Vitals:   08/24/17 1500 08/24/17 1530 08/24/17 1600 08/24/17 1630  BP: (!) 160/71 (!) 176/86 (!) 163/67 (!) 163/62  Pulse: 68 66 66 64  Resp:      SpO2: 97% 98% 99% 100%  Weight:          Constitutional: NAD, calm, comfortable Eyes: PERRL, lids and conjunctivae normal ENMT: Mucous membranes are moist. Posterior pharynx clear of any exudate or lesions.Normal dentition.  Neck: normal, supple, no masses, no thyromegaly Respiratory: clear to auscultation bilaterally, no wheezing, no crackles. Normal respiratory effort. No accessory muscle use.  Cardiovascular: Regular rate and rhythm, no murmurs / rubs / gallops. No extremity edema. 2+ pedal pulses. No carotid bruits.  Abdomen: no tenderness, no masses palpated. No hepatosplenomegaly. Bowel sounds positive.  Musculoskeletal: no clubbing / cyanosis. No joint deformity upper and lower extremities. Good ROM, no contractures. Normal muscle tone.  Skin: no rashes, lesions, ulcers. No induration Neurologic: CN 2-12 grossly intact. Sensation intact, DTR normal. Strength 5/5 in all 4.  Psychiatric: Normal judgment and insight. Alert and oriented x 3. Normal mood.     Labs on Admission: I have personally reviewed following labs and imaging studies  CBC: Recent Labs  Lab 08/24/17 1104  WBC 5.6  HGB 13.2  HCT 41.2  MCV 98.6  PLT 209   Basic Metabolic Panel: Recent Labs  Lab 08/24/17 1104  NA 143  K 3.7  CL 113*  CO2 23  GLUCOSE 150*  BUN 17  CREATININE 0.62  CALCIUM 8.8*   GFR: CrCl cannot be calculated (Unknown ideal weight.). Liver Function Tests: Recent Labs  Lab 08/24/17 1104  AST 21  ALT 15  ALKPHOS 70  BILITOT 0.5  PROT 6.4*  ALBUMIN 3.8   Recent Labs  Lab 08/24/17 1104  LIPASE 28   No results for input(s): AMMONIA in the last 168 hours. Coagulation Profile: No  results for input(s): INR, PROTIME in the last 168 hours. Cardiac Enzymes: No results for input(s): CKTOTAL, CKMB, CKMBINDEX, TROPONINI in the last 168 hours. BNP (last 3 results) No results for input(s): PROBNP in the last 8760 hours. HbA1C: No results for input(s): HGBA1C in the last 72 hours. CBG: Recent Labs  Lab 08/24/17 1115  GLUCAP 121*   Lipid Profile: No results for input(s): CHOL, HDL, LDLCALC, TRIG, CHOLHDL, LDLDIRECT in the last 72 hours. Thyroid Function Tests: No results for input(s): TSH, T4TOTAL, FREET4, T3FREE, THYROIDAB in the last 72 hours. Anemia Panel: No results for input(s): VITAMINB12, FOLATE, FERRITIN, TIBC, IRON, RETICCTPCT in the last 72 hours. Urine analysis:    Component Value Date/Time   COLORURINE YELLOW 02/02/2016 1850   APPEARANCEUR CLEAR 02/02/2016 1850   LABSPEC >1.046 (H) 02/02/2016 1850   PHURINE 6.0 02/02/2016 1850   GLUCOSEU NEGATIVE 02/02/2016 1850   HGBUR NEGATIVE 02/02/2016 1850   BILIRUBINUR NEGATIVE 02/02/2016 1850   KETONESUR NEGATIVE 02/02/2016 1850   PROTEINUR NEGATIVE 02/02/2016 1850   UROBILINOGEN 1.0 03/16/2011 0917   NITRITE NEGATIVE 02/02/2016 1850   LEUKOCYTESUR TRACE (  A) 02/02/2016 1850   Sepsis Labs: !!!!!!!!!!!!!!!!!!!!!!!!!!!!!!!!!!!!!!!!!!!! @LABRCNTIP (procalcitonin:4,lacticidven:4) )No results found for this or any previous visit (from the past 240 hour(s)).   Radiological Exams on Admission: Ct Head Wo Contrast  Result Date: 08/24/2017 CLINICAL DATA:  Dizziness, nausea, vomiting. EXAM: CT HEAD WITHOUT CONTRAST TECHNIQUE: Contiguous axial images were obtained from the base of the skull through the vertex without intravenous contrast. COMPARISON:  None. FINDINGS: Brain: Mild diffuse cortical atrophy is noted. No mass effect or midline shift is noted. Ventricular size is within normal limits. There is no evidence of mass lesion, hemorrhage or acute infarction. Vascular: No hyperdense vessel or unexpected calcification.  Skull: Normal. Negative for fracture or focal lesion. Sinuses/Orbits: No acute finding. Other: None. IMPRESSION: Mild diffuse cortical atrophy. No acute intracranial abnormality seen. Electronically Signed   By: Lupita Raider, M.D.   On: 08/24/2017 12:47   Mr Brain Wo Contrast  Result Date: 08/24/2017 CLINICAL DATA:  Acute presentation with dizziness, nausea and vomiting beginning 0800 hours. EXAM: MRI HEAD WITHOUT CONTRAST TECHNIQUE: Multiplanar, multiecho pulse sequences of the brain and surrounding structures were obtained without intravenous contrast. COMPARISON:  Head CT same day FINDINGS: Brain: Diffusion imaging does not show any acute or subacute infarction. The brainstem and cerebellum are normal. Cerebral hemispheres show moderate chronic small-vessel ischemic changes of the deep and subcortical white matter. No cortical or large vessel territory infarction. No mass lesion, hemorrhage, hydrocephalus or extra-axial collection. Vascular: Major vessels at the base of the brain show flow. Skull and upper cervical spine: Negative Sinuses/Orbits: Sinuses are clear except for a retention cyst in the left maxillary sinus. Orbits negative. Other: None significant. IMPRESSION: No acute or reversible finding. No abnormality seen to explain acute dizziness. The study shows chronic atrophy with moderate chronic appearing small vessel ischemic changes of the cerebral hemispheric white matter. Electronically Signed   By: Paulina Fusi M.D.   On: 08/24/2017 14:11     All images have been reviewed by me personally.  EKG: Independently reviewed.  Normal sinus rhythm  Assessment/Plan Principal Problem:   Dizziness Active Problems:   Essential hypertension   HLD (hyperlipidemia)    Dizziness, persistent Nausea and vomiting - Patient's dizziness is likely peripheral in nature.  CT of the head and MRI of the brain is negative-shows chronic small blood vessel ischemic changes - We will start patient on  meclizine 3 times daily.  Will add Valium as needed -Neurology has been consulted in the ER -Bedrest, normal saline at 100 cc/h for 12 hours -Check TSH, B12 and folate -We will consult PT/OT - Orthostatics tomorrow morning  Essential hypertension - Lisinopril 20 mg daily  Hyperlipidemia -Continue Lipitor 40 mg daily  Daily alcohol use -We will closely monitor for any signs of subtle withdrawal.  CIWA protocol. Counseled to quit using alcohol.   DVT prophylaxis: Early ambulation/SCDs Code Status: Full Code  Family Communication:  Daughter at Bedside  Disposition Plan:  TBD Consults called: Neuro called by ED Admission status: Obs Tele   Time Spent: 65 minutes.  >50% of the time was devoted to discussing the patients care, assessment, plan and disposition with other care givers along with counseling the patient about the risks and benefits of treatment.   Please Note: This patient record was dictated using Animal nutritionist. Chart creation errors have been sought, but may not always have been located. Such creation errors do not reflect on the Standard of Medical Care.   Ankit Joline Maxcy MD [facility] Pager [phone]-  [phone]  If 7PM-7AM, please contact night-coverage [email or web address] Password [facility]  08/24/2017, 5:14 PM

## 2017-08-24 NOTE — ED Provider Notes (Signed)
[facility]-EMERGENCY DEPT Provider Note   CSN: [id number] Arrival date & time: 08/24/17  1032     History   Chief Complaint Chief Complaint  Patient presents with  . Dizziness  . Weakness    HPI Mindy Snyder is a 67 y.o. female.  HPI   67 year old female with past medical history as below including remote history of vertigo here with dizziness.  The patient states she awoke this morning and was feeling fine.  However, proximal 1 hour after awakening, she began to develop severe dizziness.  She describes it as a room spinning sensation.  She has associated nausea and vomiting.  Did not seem to worsen with any kind of movement, nor did it improve.  She has had a history of vertigo but has never had symptoms this severe.  Denies any ear ringing.  Denies any vision changes.  No difficulty speaking or swallowing.  She was unable to walk due to her dizziness.  Denies any headache.  No recent falls or trauma.  No alleviating factors.  Past Medical History:  Diagnosis Date  . Arthritis    pain and oa left hip-plans hip replacement  -- s/p right hip replacement -doing well.  pt has arthritis pain lower back, ankles, wrists and "everywhere"  . Blood transfusion 2002   with previous hip replacement     Patient Active Problem List   Diagnosis Date Noted  . Diverticulitis 02/02/2016  . Perforation of sigmoid colon due to diverticulitis 02/02/2016  . Essential hypertension 02/02/2016  . Hypokalemia 02/02/2016  . Leukocytosis 02/02/2016  . Postop Transfusion history 04/06/2011  . Postop Acute blood loss anemia 03/25/2011  . Postop Hyponatremia 03/24/2011  . Osteoarthritis of hip 03/23/2011    Past Surgical History:  Procedure Laterality Date  . BREAST SURGERY     benign cyst removed from breast age 25  . JOINT REPLACEMENT  2002    right hip arthroplasty  . TOTAL HIP ARTHROPLASTY  03/23/2011   Procedure: TOTAL HIP ARTHROPLASTY;  Surgeon: Loanne Drilling,  MD;  Location: [facility];  Service: Orthopedics;  Laterality: Left;     OB History   None      Home Medications    Prior to Admission medications   Medication Sig Start Date End Date Taking? Authorizing Provider  atorvastatin (LIPITOR) 40 MG tablet Take 40 mg by mouth daily. 11/10/16  Yes [provider]  ibuprofen (ADVIL,MOTRIN) 200 MG tablet Take 200 mg by mouth daily as needed for mild pain or moderate pain (pain).    Yes [provider]  lisinopril (PRINIVIL,ZESTRIL) 20 MG tablet Take 20 mg by mouth daily. 01/07/16  Yes [provider]    Family History Family History  Problem Relation Age of Onset  . Hypertension Mother   . Hyperlipidemia Mother   . Hypertension Father   . Diabetes Father   . Hyperlipidemia Father     Social History Social History   Tobacco Use  . Smoking status: Former Smoker    Packs/day: 1.00    Years: 25.00    Pack years: 25.00    Types: Cigarettes  . Smokeless tobacco: Never Used  . Tobacco comment: stopped smoking sept 2010  Substance Use Topics  . Alcohol use: Yes    Comment: glass of wine daily  . Drug use: No     Allergies   Codeine   Review of Systems Review of Systems  Constitutional: Positive for fatigue. Negative for chills and  fever.  HENT: Negative for congestion, rhinorrhea and sore throat.   Eyes: Negative for visual disturbance.  Respiratory: Negative for cough, shortness of breath and wheezing.   Cardiovascular: Negative for chest pain and leg swelling.  Gastrointestinal: Positive for nausea and vomiting. Negative for abdominal pain and diarrhea.  Genitourinary: Negative for dysuria, flank pain, vaginal bleeding and vaginal discharge.  Musculoskeletal: Positive for gait problem. Negative for neck pain.  Skin: Negative for rash.  Allergic/Immunologic: Negative for immunocompromised state.  Neurological: Positive for dizziness. Negative for syncope and headaches.  Hematological: Does not  bruise/bleed easily.  All other systems reviewed and are negative.    Physical Exam Updated Vital Signs BP (!) 153/74   Pulse 63   Resp 15   Wt 69.4 kg (153 lb)   SpO2 96%   BMI 26.26 kg/m   Physical Exam  Constitutional: She appears well-developed and well-nourished. No distress.  HENT:  Head: Normocephalic and atraumatic.  Eyes: Conjunctivae are normal.  Neck: Neck supple.  Cardiovascular: Normal rate, regular rhythm and normal heart sounds. Exam reveals no friction rub.  No murmur heard. Pulmonary/Chest: Effort normal and breath sounds normal. No respiratory distress. She has no wheezes. She has no rales.  Abdominal: She exhibits no distension.  Musculoskeletal: She exhibits no edema.  Neurological: She exhibits normal muscle tone.  Skin: Skin is warm. Capillary refill takes less than 2 seconds.  Psychiatric: She has a normal mood and affect.  Nursing note and vitals reviewed.   Neurological Exam:  Mental Status: Alert and oriented to person, place, and time. Attention and concentration normal. Speech clear. Recent memory is intact. Cranial Nerves: Visual fields grossly intact. EOMI and PERRLA. Rightward beating nystagmus bilaterally, horizontally. Facial sensation intact at forehead, maxillary cheek, and chin/mandible bilaterally. No facial asymmetry or weakness. Hearing grossly normal. Uvula is midline, and palate elevates symmetrically. Normal SCM and trapezius strength. Tongue midline without fasciculations. Motor: Muscle strength 5/5 in proximal and distal UE and LE bilaterally. No pronator drift. Muscle tone normal. Reflexes: 2+ and symmetrical in all four extremities.  Sensation: Intact to light touch in upper and lower extremities distally bilaterally.  Gait: Deferred Coordination: Deferred, unable to complete 2/2 vertigo.    ED Treatments / Results  Labs (all labs ordered are listed, but only abnormal results are displayed) Labs Reviewed  BASIC METABOLIC  PANEL - Abnormal; Notable for the following components:      Result Value   Chloride 113 (*)    Glucose, Bld 150 (*)    Calcium 8.8 (*)    All other components within normal limits  HEPATIC FUNCTION PANEL - Abnormal; Notable for the following components:   Total Protein 6.4 (*)    All other components within normal limits  CBG MONITORING, ED - Abnormal; Notable for the following components:   Glucose-Capillary 121 (*)    All other components within normal limits  CBC  LIPASE, BLOOD  I-STAT TROPONIN, ED    EKG EKG Interpretation  Date/Time:  Thursday August 24 2017 11:02:49 EDT Ventricular Rate:  66 PR Interval:    QRS Duration: 92 QT Interval:  464 QTC Calculation: 487 R Axis:   65 Text Interpretation:  Sinus rhythm Borderline prolonged QT interval No significant change since last tracing Confirmed by Shaune PollackIsaacs, Zoua Caporaso [phone]) on 08/24/2017 1:09:05 PM   Radiology Ct Head Wo Contrast  Result Date: 08/24/2017 CLINICAL DATA:  Dizziness, nausea, vomiting. EXAM: CT HEAD WITHOUT CONTRAST TECHNIQUE: Contiguous axial images were obtained from the base of  the skull through the vertex without intravenous contrast. COMPARISON:  None. FINDINGS: Brain: Mild diffuse cortical atrophy is noted. No mass effect or midline shift is noted. Ventricular size is within normal limits. There is no evidence of mass lesion, hemorrhage or acute infarction. Vascular: No hyperdense vessel or unexpected calcification. Skull: Normal. Negative for fracture or focal lesion. Sinuses/Orbits: No acute finding. Other: None. IMPRESSION: Mild diffuse cortical atrophy. No acute intracranial abnormality seen. Electronically Signed   By: Lupita Raider, M.D.   On: 08/24/2017 12:47   Mr Brain Wo Contrast  Result Date: 08/24/2017 CLINICAL DATA:  Acute presentation with dizziness, nausea and vomiting beginning 0800 hours. EXAM: MRI HEAD WITHOUT CONTRAST TECHNIQUE: Multiplanar, multiecho pulse sequences of the brain and  surrounding structures were obtained without intravenous contrast. COMPARISON:  Head CT same day FINDINGS: Brain: Diffusion imaging does not show any acute or subacute infarction. The brainstem and cerebellum are normal. Cerebral hemispheres show moderate chronic small-vessel ischemic changes of the deep and subcortical white matter. No cortical or large vessel territory infarction. No mass lesion, hemorrhage, hydrocephalus or extra-axial collection. Vascular: Major vessels at the base of the brain show flow. Skull and upper cervical spine: Negative Sinuses/Orbits: Sinuses are clear except for a retention cyst in the left maxillary sinus. Orbits negative. Other: None significant. IMPRESSION: No acute or reversible finding. No abnormality seen to explain acute dizziness. The study shows chronic atrophy with moderate chronic appearing small vessel ischemic changes of the cerebral hemispheric white matter. Electronically Signed   By: Paulina Fusi M.D.   On: 08/24/2017 14:11    Procedures Procedures (including critical care time)  Medications Ordered in ED Medications  predniSONE (DELTASONE) tablet 40 mg (has no administration in time range)  LORazepam (ATIVAN) injection 1 mg (1 mg Intravenous Given 08/24/17 1122)  meclizine (ANTIVERT) tablet 25 mg (25 mg Oral Given 08/24/17 1122)  0.9 %  sodium chloride infusion ( Intravenous Stopped 08/24/17 1323)  ondansetron (ZOFRAN) injection 4 mg (4 mg Intravenous Given 08/24/17 1318)  lisinopril (PRINIVIL,ZESTRIL) tablet 20 mg (20 mg Oral Given 08/24/17 1452)     Initial Impression / Assessment and Plan / ED Course  I have reviewed the triage vital signs and the nursing notes.  Pertinent labs & imaging results that were available during my care of the patient were reviewed by me and considered in my medical decision making (see chart for details).     67 year old female here with acute onset of dizziness, nausea, vomiting, and sensation of room spinning.   History and exam is consistent with likely peripheral vertigo. DDx also includes hypoperfusion from arrhythmia, also HTN urgency given marked HTn on arrival.  However, given her risk factors, MRI obtained and is negative for acute ischemia.  Given ongoing, constant symptoms with negative MRI, suspect this is more so secondary to a peripheral etiology.  Does not change or worsen with head position, making BPPV less likely.  Possibly idiopathic versus BPPV versus Mnire's disease. Also may be related to transient HTN, ? bradycardia Patient was given meclizine, Ativan, antiemetics, and remains nauseous, unable to really eat or drink, and unable to ambulate independently.  Will start her on a course of prednisone.  Given that she lives alone, is unable to ambulate, will admit for observation, sx control, further work-up.  Will also have Teleneuro see pt for reccs. Pt updated and in agreement. Epley maneuvers not successful. Admit to hospitalist.  Final Clinical Impressions(s) / ED Diagnoses   Final diagnoses:  Non-intractable  vomiting with nausea, unspecified vomiting type    ED Discharge Orders    None       Shaune Pollack, MD 08/24/17 [phone]

## 2017-08-24 NOTE — ED Notes (Signed)
ED TO INPATIENT HANDOFF REPORT  Name/Age/Gender Mindy Snyder 67 y.o. female  Code Status    Code Status Orders  (From admission, onward)        Start     Ordered   08/24/17 1621  Full code  Continuous     08/24/17 1621    Code Status History    Date Active Date Inactive Code Status Order ID Comments User Context   02/02/2016 2352 02/05/2016 2228 Full Code [id number]  Norval Morton, MD ED   03/23/2011 2021 03/26/2011 1647 Full Code [id number]  Peggye Fothergill, RN Inpatient      Home/SNF/Other Home  Chief Complaint dizziness n/v  Level of Care/Admitting Diagnosis ED Disposition    ED Disposition Condition [facility] Area: [facility] [[id number]]  Level of Care: Telemetry [5]  Admit to tele based on following criteria: Eval of Syncope  Diagnosis: Dizziness [379024]  Admitting Physician: Gerlean Ren [facility] [[id number]]  Attending Physician: Gerlean Ren [facility] [[id number]]  PT Class (Do Not Modify): Observation [104]  PT Acc Code (Do Not Modify): Observation [10022]       Medical History Past Medical History:  Diagnosis Date  . Arthritis    pain and oa left hip-plans hip replacement  -- s/p right hip replacement -doing well.  pt has arthritis pain lower back, ankles, wrists and "everywhere"  . Blood transfusion 2002   with previous hip replacement     Allergies Allergies  Allergen Reactions  . Codeine Other (See Comments) and Nausea Only    Upset Stomach    IV Location/Drains/Wounds Patient Lines/Drains/Airways Status   Active Line/Drains/Airways    Name:   Placement date:   Placement time:   Site:   Days:   Peripheral IV 08/24/17 Left Hand   08/24/17    -    Hand   less than 1   Incision 03/23/11 Hip Left   03/23/11    1619     2346          Labs/Imaging Results for orders placed or performed during the hospital encounter of 08/24/17 (from the past 48 hour(s))  Basic metabolic panel     Status: Abnormal    Collection Time: 08/24/17 11:04 AM  Result Value Ref Range   Sodium 143 135 - 145 mmol/L   Potassium 3.7 3.5 - 5.1 mmol/L   Chloride 113 (H) 98 - 111 mmol/L    Comment: Please note change in reference range.   CO2 23 22 - 32 mmol/L   Glucose, Bld 150 (H) 70 - 99 mg/dL    Comment: Please note change in reference range.   BUN 17 8 - 23 mg/dL    Comment: Please note change in reference range.   Creatinine, Ser 0.62 0.44 - 1.00 mg/dL   Calcium 8.8 (L) 8.9 - 10.3 mg/dL   GFR calc non Af Amer >60 >60 mL/min   GFR calc Af Amer >60 >60 mL/min    Comment: (NOTE) The eGFR has been calculated using the CKD EPI equation. This calculation has not been validated in all clinical situations. eGFR's persistently <60 mL/min signify possible Chronic Kidney Disease.    Anion gap 7 5 - 15    Comment: Performed at [facility], [place] [address], [place], [place] [address]  CBC     Status: None   Collection Time: 08/24/17 11:04 AM  Result Value Ref Range   WBC 5.6 4.0 - 10.5 K/uL  RBC 4.18 3.87 - 5.11 MIL/uL   Hemoglobin 13.2 12.0 - 15.0 g/dL   HCT 41.2 36.0 - 46.0 %   MCV 98.6 78.0 - 100.0 fL   MCH 31.6 26.0 - 34.0 pg   MCHC 32.0 30.0 - 36.0 g/dL   RDW 13.4 11.5 - 15.5 %   Platelets 209 150 - 400 K/uL    Comment: Performed at [facility], [place] [address] [place], [place] [address]  Hepatic function panel     Status: Abnormal   Collection Time: 08/24/17 11:04 AM  Result Value Ref Range   Total Protein 6.4 (L) 6.5 - 8.1 g/dL   Albumin 3.8 3.5 - 5.0 g/dL   AST 21 15 - 41 U/L   ALT 15 0 - 44 U/L    Comment: Please note change in reference range.   Alkaline Phosphatase 70 38 - 126 U/L   Total Bilirubin 0.5 0.3 - 1.2 mg/dL   Bilirubin, Direct 0.1 0.0 - 0.2 mg/dL    Comment: Please note change in reference range.   Indirect Bilirubin 0.4 0.3 - 0.9 mg/dL    Comment: Performed at [facility], [place] [address], [place], [place] [address]   Lipase, blood     Status: None   Collection Time: 08/24/17 11:04 AM  Result Value Ref Range   Lipase 28 11 - 51 U/L    Comment: Performed at [facility], [place] [address] [place], [place] [address]  I-Stat Troponin, ED (not at [facility])     Status: None   Collection Time: 08/24/17 11:09 AM  Result Value Ref Range   Troponin i, poc 0.00 0.00 - 0.08 ng/mL   Comment 3            Comment: Due to the release kinetics of cTnI, a negative result within the first hours of the onset of symptoms does not rule out myocardial infarction with certainty. If myocardial infarction is still suspected, repeat the test at appropriate intervals.   CBG monitoring, ED     Status: Abnormal   Collection Time: 08/24/17 11:15 AM  Result Value Ref Range   Glucose-Capillary 121 (H) 70 - 99 mg/dL   Ct Head Wo Contrast  Result Date: 08/24/2017 CLINICAL DATA:  Dizziness, nausea, vomiting. EXAM: CT HEAD WITHOUT CONTRAST TECHNIQUE: Contiguous axial images were obtained from the base of the skull through the vertex without intravenous contrast. COMPARISON:  None. FINDINGS: Brain: Mild diffuse cortical atrophy is noted. No mass effect or midline shift is noted. Ventricular size is within normal limits. There is no evidence of mass lesion, hemorrhage or acute infarction. Vascular: No hyperdense vessel or unexpected calcification. Skull: Normal. Negative for fracture or focal lesion. Sinuses/Orbits: No acute finding. Other: None. IMPRESSION: Mild diffuse cortical atrophy. No acute intracranial abnormality seen. Electronically Signed   By: Marijo Conception, M.D.   On: 08/24/2017 12:47   Mr Brain Wo Contrast  Result Date: 08/24/2017 CLINICAL DATA:  Acute presentation with dizziness, nausea and vomiting beginning 0800 hours. EXAM: MRI HEAD WITHOUT CONTRAST TECHNIQUE: Multiplanar, multiecho pulse sequences of the brain and surrounding structures were obtained without intravenous contrast. COMPARISON:  Head CT same  day FINDINGS: Brain: Diffusion imaging does not show any acute or subacute infarction. The brainstem and cerebellum are normal. Cerebral hemispheres show moderate chronic small-vessel ischemic changes of the deep and subcortical white matter. No cortical or large vessel territory infarction. No mass lesion, hemorrhage, hydrocephalus or extra-axial collection. Vascular: Major vessels  at the base of the brain show flow. Skull and upper cervical spine: Negative Sinuses/Orbits: Sinuses are clear except for a retention cyst in the left maxillary sinus. Orbits negative. Other: None significant. IMPRESSION: No acute or reversible finding. No abnormality seen to explain acute dizziness. The study shows chronic atrophy with moderate chronic appearing small vessel ischemic changes of the cerebral hemispheric white matter. Electronically Signed   By: Nelson Chimes M.D.   On: 08/24/2017 14:11    Pending Labs Unresulted Labs (From admission, onward)   Start     Ordered   08/25/17 0500  Comprehensive metabolic panel  Tomorrow morning,   R     08/24/17 1621   08/25/17 0500  Protime-INR  Tomorrow morning,   R     08/24/17 1621   08/25/17 0500  APTT  Tomorrow morning,   R     08/24/17 1621   08/25/17 0500  CBC  Tomorrow morning,   R     08/24/17 1621   08/24/17 1621  TSH  Once,   R     08/24/17 1621   08/24/17 1620  HIV antibody (Routine Testing)  Once,   R     08/24/17 1621      Vitals/Pain Today's Vitals   08/24/17 1500 08/24/17 1530 08/24/17 1600 08/24/17 1630  BP: (!) 160/71 (!) 176/86 (!) 163/67 (!) 163/62  Pulse: 68 66 66 64  Resp:      SpO2: 97% 98% 99% 100%  Weight:      PainSc:        Isolation Precautions No active isolations  Medications Medications  predniSONE (DELTASONE) tablet 40 mg (has no administration in time range)  lisinopril (PRINIVIL,ZESTRIL) tablet 20 mg (has no administration in time range)  ibuprofen (ADVIL,MOTRIN) tablet 200 mg (has no administration in time range)   atorvastatin (LIPITOR) tablet 40 mg (has no administration in time range)  0.9 %  sodium chloride infusion (has no administration in time range)  acetaminophen (TYLENOL) tablet 650 mg (has no administration in time range)    Or  acetaminophen (TYLENOL) suppository 650 mg (has no administration in time range)  senna-docusate (Senokot-S) tablet 1 tablet (has no administration in time range)  bisacodyl (DULCOLAX) EC tablet 5 mg (has no administration in time range)  LORazepam (ATIVAN) injection 1 mg (1 mg Intravenous Given 08/24/17 1122)  meclizine (ANTIVERT) tablet 25 mg (25 mg Oral Given 08/24/17 1122)  0.9 %  sodium chloride infusion ( Intravenous Stopped 08/24/17 1323)  ondansetron (ZOFRAN) injection 4 mg (4 mg Intravenous Given 08/24/17 1318)  lisinopril (PRINIVIL,ZESTRIL) tablet 20 mg (20 mg Oral Given 08/24/17 1452)    Mobility walks with person assist

## 2017-08-24 NOTE — ED Notes (Signed)
I stat Trop=0.00

## 2017-08-24 NOTE — ED Notes (Signed)
Patient unable to give urine sample at the moment. 

## 2017-08-24 NOTE — ED Notes (Signed)
Ambulated pt, pt was very dizzy she states she gets more woozy and dizzy when she begins to look up.. Also pt was having a hard time walking with her eyes open, when her eyes are open she being to get the dizzy feeling more.

## 2017-08-24 NOTE — ED Notes (Signed)
Istat Trop. Given to MD

## 2017-08-24 NOTE — ED Triage Notes (Signed)
Patient BIB EMS for complaints of dizziness and nausea/vomiting, starting approx 0800 this morning. EMS reports en route, the patient became diaphoretic and became bradycardic at a rate of 48, then corrected herself and came back up to a rate of 79. Patient denies fever/diarrhea. Patient reports her only history of HTN and Hyperlipidemia. EMS current VS- BP 164/82, HR82, CBG=147 20G Left Hand PIV placed by EMS. 4mg  IV Zofran administered by EMS en route.

## 2017-08-24 NOTE — ED Notes (Signed)
Bed: WA24 Expected date:  Expected time:  Means of arrival:  Comments: EMS-vomiting 

## 2017-08-24 NOTE — ED Notes (Signed)
Patient transported to MRI 

## 2017-08-25 DIAGNOSIS — R112 Nausea with vomiting, unspecified: Secondary | ICD-10-CM

## 2017-08-25 DIAGNOSIS — R42 Dizziness and giddiness: Secondary | ICD-10-CM

## 2017-08-25 DIAGNOSIS — I10 Essential (primary) hypertension: Secondary | ICD-10-CM | POA: Diagnosis not present

## 2017-08-25 DIAGNOSIS — E785 Hyperlipidemia, unspecified: Secondary | ICD-10-CM

## 2017-08-25 LAB — COMPREHENSIVE METABOLIC PANEL
ALBUMIN: 3.5 g/dL (ref 3.5–5.0)
ALT: 16 U/L (ref 0–44)
AST: 21 U/L (ref 15–41)
Alkaline Phosphatase: 70 U/L (ref 38–126)
Anion gap: 6 (ref 5–15)
BILIRUBIN TOTAL: 0.4 mg/dL (ref 0.3–1.2)
BUN: 14 mg/dL (ref 8–23)
CO2: 23 mmol/L (ref 22–32)
CREATININE: 0.63 mg/dL (ref 0.44–1.00)
Calcium: 9 mg/dL (ref 8.9–10.3)
Chloride: 113 mmol/L — ABNORMAL HIGH (ref 98–111)
GFR calc non Af Amer: >60 mL/min (ref >60)
Glucose, Bld: 146 mg/dL — ABNORMAL HIGH (ref 70–99)
POTASSIUM: 3.8 mmol/L (ref 3.5–5.1)
SODIUM: 142 mmol/L (ref 135–145)
TOTAL PROTEIN: 6.2 g/dL — AB (ref 6.5–8.1)

## 2017-08-25 LAB — FOLATE RBC
Folate, Hemolysate: 328.1 ng/mL
Folate, RBC: 816 ng/mL (ref >498)
Hematocrit: 40.2 % (ref 34.0–46.6)

## 2017-08-25 LAB — CBC
HCT: 41.5 % (ref 36.0–46.0)
Hemoglobin: 13.2 g/dL (ref 12.0–15.0)
MCH: 30.9 pg (ref 26.0–34.0)
MCHC: 31.8 g/dL (ref 30.0–36.0)
MCV: 97.2 fL (ref 78.0–100.0)
PLATELETS: 239 10*3/uL (ref 150–400)
RBC: 4.27 MIL/uL (ref 3.87–5.11)
RDW: 13.4 % (ref 11.5–15.5)
WBC: 6.8 10*3/uL (ref 4.0–10.5)

## 2017-08-25 LAB — PROTIME-INR
INR: 1
PROTHROMBIN TIME: 13.1 s (ref 11.4–15.2)

## 2017-08-25 LAB — APTT: aPTT: 24 seconds (ref 24–36)

## 2017-08-25 LAB — HIV ANTIBODY (ROUTINE TESTING W REFLEX): HIV Screen 4th Generation wRfx: NONREACTIVE

## 2017-08-25 MED ORDER — MECLIZINE HCL 25 MG PO TABS: 25.0000 mg | ORAL_TABLET | Freq: Three times a day (TID) | ORAL | 0 refills | Status: AC | PRN

## 2017-08-25 MED ORDER — ACETAMINOPHEN 325 MG PO TABS: 650.0000 mg | ORAL_TABLET | Freq: Four times a day (QID) | ORAL | Status: AC | PRN

## 2017-08-25 NOTE — Evaluation (Signed)
Occupational Therapy Evaluation Patient Details Name: Mindy Snyder MRN: [id number] DOB: [birth year] Today's Date: 08/25/2017    History of Present Illness pt was admitted with dizziness/spinning.  H/O BPPV 7 years ago   Clinical Impression   This 67 year old female was admitted for the above. She no longer feels spinning but has a sensation of slight lightheadness.  Recommend initial 24/7 for safety    Follow Up Recommendations  No OT follow up;Supervision/Assistance - 24 hour(initially)    Equipment Recommendations  None recommended by OT    Recommendations for Other Services       Precautions / Restrictions Precautions Precautions: Fall Restrictions Weight Bearing Restrictions: No      Mobility Bed Mobility Overal bed mobility: Independent                Transfers Overall transfer level: Needs assistance Equipment used: 1 person hand held assist Transfers: Sit to/from Stand Sit to Stand: Min guard         General transfer comment: for safety.  Pt guarded when walking. No LOB    Balance                                           ADL either performed or assessed with clinical judgement   ADL Overall ADL's : Needs assistance/impaired                         Toilet Transfer: Min guard;Ambulation             General ADL Comments: set up for adl, sit to stand. Min guard when walking for safety     Vision         Perception     Praxis      Pertinent Vitals/Pain Pain Assessment: No/denies pain     Hand Dominance     Extremity/Trunk Assessment Upper Extremity Assessment Upper Extremity Assessment: Overall WFL for tasks assessed           Communication Communication Communication: No difficulties   Cognition Arousal/Alertness: Awake/alert Behavior During Therapy: WFL for tasks assessed/performed Overall Cognitive Status: Within Functional Limits for tasks assessed                                      General Comments  orthostatics done:  see vitals section.  negative    Exercises     Shoulder Instructions      Home Living Family/patient expects to be discharged to:: Private residence Living Arrangements: Alone Available Help at Discharge: Family               Bathroom Shower/Tub: Walk-in Human resources officershower   Bathroom Toilet: Standard     Home Equipment: None   Additional Comments: pt is getting ready for family trip to [place] tomorrow      Prior Functioning/Environment Level of Independence: Independent                 OT Problem List:        OT Treatment/Interventions:      OT Goals(Current goals can be found in the care plan section) Acute Rehab OT Goals Patient Stated Goal: home asap OT Goal Formulation: All assessment and education complete, DC therapy  OT Frequency:  Barriers to D/C:            Co-evaluation              AM-PAC PT "6 Clicks" Daily Activity     Outcome Measure Help from another person eating meals?: None Help from another person taking care of personal grooming?: A Little Help from another person toileting, which includes using toliet, bedpan, or urinal?: A Little Help from another person bathing (including washing, rinsing, drying)?: A Little Help from another person to put on and taking off regular upper body clothing?: A Little Help from another person to put on and taking off regular lower body clothing?: A Little 6 Click Score: 19   End of Session    Activity Tolerance: Patient tolerated treatment well Patient left: in chair;with call bell/phone within reach  OT Visit Diagnosis: Dizziness and giddiness (R42)                Time: 1610-9604 OT Time Calculation (min): 28 min Charges:  OT General Charges $OT Visit: 1 Visit OT Evaluation $OT Eval Low Complexity: 1 Low OT Treatments $Self Care/Home Management : 8-22 mins G-Codes:     [facility],  OTR/L [phone] 08/25/2017  Mindy Snyder 08/25/2017, 8:49 AM

## 2017-08-25 NOTE — Evaluation (Signed)
Physical Therapy Evaluation Patient Details Name: Mindy Snyder MRN: [id number] DOB: 20-Oct-1950 Today's Date: 08/25/2017   History of Present Illness  PT 67 y.o. female admitted 06/27/219 with c/o dizziness. MRI did not show central etiology; MD suspects peripheral etiology. PMH: BPPV, HTN, HLD, s/ p R THA 2002; s/p L THA 2013.    Clinical Impression  Patient overall performed well with mild LOBs during ambulation without assistive devices when turning head side-to-side. Nystagmus noted with head turns side-to-side. Patient evaluated by Physical Therapy with no further acute PT needs identified. All education has been completed and the patient has no further questions. Patient would benefit from ambulation with nursing to maintain current mobility skills and strength/balance. See below for any follow-up Physical Therapy or equipment needs. PT is signing off. Thank you for this referral.     Follow Up Recommendations Outpatient PT(dizziness - peripheral etiology)    Equipment Recommendations  None recommended by PT    Recommendations for Other Services       Precautions / Restrictions Precautions Precautions: Fall Restrictions Weight Bearing Restrictions: No      Mobility  Bed Mobility Overal bed mobility: Independent                Transfers Overall transfer level: Needs assistance Equipment used: None Transfers: Sit to/from Stand;Stand Pivot Transfers Sit to Stand: Supervision Stand pivot transfers: Supervision       General transfer comment: for safety  Ambulation/Gait Ambulation/Gait assistance: Supervision Gait Distance (Feet): 450 Feet Assistive device: None Gait Pattern/deviations: Step-through pattern;Decreased stride length Gait velocity: decr   General Gait Details: mild LOBs during ambulation with head turns; mild nystagmus noted with head turns  Financial trader [employer] (Stroke Patients Only)        Balance Overall balance assessment: Mild deficits observed, not formally tested                                           Pertinent Vitals/Pain Pain Assessment: No/denies pain    Home Living Family/patient expects to be discharged to:: Private residence Living Arrangements: Alone Available Help at Discharge: Family Type of Home: Other(Comment)(condo) Home Access: Stairs to enter Entrance Stairs-Rails: None Entrance Stairs-Number of Steps: 1 Home Layout: One level Home Equipment: None Additional Comments: pt is getting ready for family trip to [place] tomorrow    Prior Function Level of Independence: Independent               Hand Dominance        Extremity/Trunk Assessment   Upper Extremity Assessment Upper Extremity Assessment: Overall WFL for tasks assessed    Lower Extremity Assessment Lower Extremity Assessment: Overall WFL for tasks assessed       Communication   Communication: No difficulties  Cognition Arousal/Alertness: Awake/alert Behavior During Therapy: WFL for tasks assessed/performed Overall Cognitive Status: Within Functional Limits for tasks assessed                                        General Comments General comments (skin integrity, edema, etc.): orthostatics done: see vitals section. negative.    Exercises     Assessment/Plan    PT Assessment All further PT needs can  be met in the next venue of care  PT Problem List Decreased balance       PT Treatment Interventions      PT Goals (Current goals can be found in the Care Plan section)  Acute Rehab PT Goals Patient Stated Goal: return home Time For Goal Achievement: 09/01/17 Potential to Achieve Goals: Good    Frequency     Barriers to discharge        Co-evaluation               AM-PAC PT "6 Clicks" Daily Activity  Outcome Measure Difficulty turning over in bed (including adjusting bedclothes, sheets and blankets)?:  None Difficulty moving from lying on back to sitting on the side of the bed? : None Difficulty sitting down on and standing up from a chair with arms (e.g., wheelchair, bedside commode, etc,.)?: A Little Help needed moving to and from a bed to chair (including a wheelchair)?: A Little Help needed walking in hospital room?: A Little Help needed climbing 3-5 steps with a railing? : A Little 6 Click Score: 20    End of Session   Activity Tolerance: Patient tolerated treatment well Patient left: in bed;with call bell/phone within reach Nurse Communication: Mobility status PT Visit Diagnosis: Unsteadiness on feet (R26.81)    Time: 3887-1959 PT Time Calculation (min) (ACUTE ONLY): 25 min   Charges:   PT Evaluation $PT Eval Low Complexity: 1 Low PT Treatments $Gait Training: 8-22 mins   PT G Codes:        Fredi Hurtado D. Hartnett-Rands, MS, PT Per [place] [phone] 08/25/2017, 11:07 AM

## 2017-08-25 NOTE — Discharge Summary (Addendum)
Physician Discharge Summary  Mindy BertholdHelen C Van Snyder ZOX:[id number] DOB: 02/19/1951 DOA: 08/24/2017  PCP: Iva BoopVia, Kevin, MD  Admit date: 08/24/2017 Discharge date: 08/25/2017  Admitted From: home  Disposition:  home   Recommendations for Outpatient Follow-up:  1. F/u on vertigo  Discharge Condition:  stable   CODE STATUS:  Full code   Diet recommendation:  Heart healthy Consultations:  none    Discharge Diagnoses:  Principal Problem:   Vertigo Active Problems:   Essential hypertension   HLD (hyperlipidemia)   Brief Summary: Mindy MohHelen C Van Snyder is a 67 y.o. female with medical history significant of essential hypertension, hyperlipidemia came to the hospital for evaluation of dizziness.  Patient states she was feeling well up until this morning and then around 10 AM she started feeling very dizzy, describes as room spinning around with feeling of nausea and couple of episodes of nonbloody vomiting.  Since then she has also had little generalized headache and off and on had diplopia.  Denied any blurry vision, slurred speech, any other focal neuro deficits, tinnitus, recent URI, fever, chills, medications changes, chest pain, shortness of breath, abdominal pain.  Per daughter who is an occupational therapist is also at bedside stated she had similar episode about 7 years ago and was treated with Epley maneuver but this time its more severe therefore came to the ER.    Hospital Course:  Vertigo with nausea CT  Head: Mild diffuse cortical atrophy. No acute intracranial abnormality Seen. MRI Brain: No acute or reversible finding. No abnormality seen to explain acute dizziness. The study shows chronic atrophy with moderate chronic appearing small vessel ischemic changes of the cerebral hemispheric white matter - Meclizine TID started - symptoms improved today- mild "dizziness" present - will d/c home with Meclizine PRN  Essential HTN and HLD - continue home meds   Discharge  Exam: Vitals:   08/25/17 0009 08/25/17 0520  BP: 135/73 123/71  Pulse: 79 70  Resp: 18 19  Temp: 98.3 F (36.8 C) (!) 97.5 F (36.4 C)  SpO2: 95% 98%   Vitals:   08/24/17 1808 08/24/17 2039 08/25/17 0009 08/25/17 0520  BP: (!) 168/72 135/70 135/73 123/71  Pulse: 65 92 79 70  Resp: 18 19 18 19   Temp: (!) 97.4 F (36.3 C) 97.8 F (36.6 C) 98.3 F (36.8 C) (!) 97.5 F (36.4 C)  TempSrc:  Oral Oral Oral  SpO2: 99% 97% 95% 98%  Weight:        General: Pt is alert, awake, not in acute distress Cardiovascular: RRR, S1/S2 +, no rubs, no gallops Respiratory: CTA bilaterally, no wheezing, no rhonchi Abdominal: Soft, NT, ND, bowel sounds + Extremities: no edema, no cyanosis   Discharge Instructions  Discharge Instructions    Diet - low sodium heart healthy   Complete by:  As directed    Increase activity slowly   Complete by:  As directed      Allergies as of 08/25/2017      Reactions   Codeine Other (See Comments), Nausea Only   Upset Stomach      Medication List    TAKE these medications   acetaminophen 325 MG tablet Commonly known as:  TYLENOL Take 2 tablets (650 mg total) by mouth every 6 (six) hours as needed for mild pain (or Fever >/= 101).   atorvastatin 40 MG tablet Commonly known as:  LIPITOR Take 40 mg by mouth daily.   ibuprofen 200 MG tablet Commonly known as:  ADVIL,MOTRIN Take 200  mg by mouth daily as needed for mild pain or moderate pain (pain).   lisinopril 20 MG tablet Commonly known as:  PRINIVIL,ZESTRIL Take 20 mg by mouth daily.   meclizine 25 MG tablet Commonly known as:  ANTIVERT Take 1 tablet (25 mg total) by mouth 3 (three) times daily as needed for dizziness.       Allergies  Allergen Reactions  . Codeine Other (See Comments) and Nausea Only    Upset Stomach     Procedures/Studies:    Ct Head Wo Contrast  Result Date: 08/24/2017 CLINICAL DATA:  Dizziness, nausea, vomiting. EXAM: CT HEAD WITHOUT CONTRAST TECHNIQUE:  Contiguous axial images were obtained from the base of the skull through the vertex without intravenous contrast. COMPARISON:  None. FINDINGS: Brain: Mild diffuse cortical atrophy is noted. No mass effect or midline shift is noted. Ventricular size is within normal limits. There is no evidence of mass lesion, hemorrhage or acute infarction. Vascular: No hyperdense vessel or unexpected calcification. Skull: Normal. Negative for fracture or focal lesion. Sinuses/Orbits: No acute finding. Other: None. IMPRESSION: Mild diffuse cortical atrophy. No acute intracranial abnormality seen. Electronically Signed   By: Lupita Raider, M.D.   On: 08/24/2017 12:47   Mr Brain Wo Contrast  Result Date: 08/24/2017 CLINICAL DATA:  Acute presentation with dizziness, nausea and vomiting beginning 0800 hours. EXAM: MRI HEAD WITHOUT CONTRAST TECHNIQUE: Multiplanar, multiecho pulse sequences of the brain and surrounding structures were obtained without intravenous contrast. COMPARISON:  Head CT same day FINDINGS: Brain: Diffusion imaging does not show any acute or subacute infarction. The brainstem and cerebellum are normal. Cerebral hemispheres show moderate chronic small-vessel ischemic changes of the deep and subcortical white matter. No cortical or large vessel territory infarction. No mass lesion, hemorrhage, hydrocephalus or extra-axial collection. Vascular: Major vessels at the base of the brain show flow. Skull and upper cervical spine: Negative Sinuses/Orbits: Sinuses are clear except for a retention cyst in the left maxillary sinus. Orbits negative. Other: None significant. IMPRESSION: No acute or reversible finding. No abnormality seen to explain acute dizziness. The study shows chronic atrophy with moderate chronic appearing small vessel ischemic changes of the cerebral hemispheric white matter. Electronically Signed   By: Paulina Fusi M.D.   On: 08/24/2017 14:11     The results of significant diagnostics from this  hospitalization (including imaging, microbiology, ancillary and laboratory) are listed below for reference.     Microbiology: No results found for this or any previous visit (from the past 240 hour(s)).   Labs: BNP (last 3 results) No results for input(s): BNP in the last 8760 hours. Basic Metabolic Panel: Recent Labs  Lab 08/24/17 1104 08/25/17 0550  NA 143 142  K 3.7 3.8  CL 113* 113*  CO2 23 23  GLUCOSE 150* 146*  BUN 17 14  CREATININE 0.62 0.63  CALCIUM 8.8* 9.0   Liver Function Tests: Recent Labs  Lab 08/24/17 1104 08/25/17 0550  AST 21 21  ALT 15 16  ALKPHOS 70 70  BILITOT 0.5 0.4  PROT 6.4* 6.2*  ALBUMIN 3.8 3.5   Recent Labs  Lab 08/24/17 1104  LIPASE 28   No results for input(s): AMMONIA in the last 168 hours. CBC: Recent Labs  Lab 08/24/17 1104 08/25/17 0550  WBC 5.6 6.8  HGB 13.2 13.2  HCT 41.2 41.5  MCV 98.6 97.2  PLT 209 239   Cardiac Enzymes: No results for input(s): CKTOTAL, CKMB, CKMBINDEX, TROPONINI in the last 168 hours. BNP: Invalid  input(s): POCBNP CBG: Recent Labs  Lab 08/24/17 1115  GLUCAP 121*   D-Dimer No results for input(s): DDIMER in the last 72 hours. Hgb A1c No results for input(s): HGBA1C in the last 72 hours. Lipid Profile No results for input(s): CHOL, HDL, LDLCALC, TRIG, CHOLHDL, LDLDIRECT in the last 72 hours. Thyroid function studies Recent Labs    08/24/17 1748  TSH 0.437   Anemia work up Recent Labs    08/24/17 1748  VITAMINB12 190   Urinalysis    Component Value Date/Time   COLORURINE YELLOW 02/02/2016 1850   APPEARANCEUR CLEAR 02/02/2016 1850   LABSPEC >1.046 (H) 02/02/2016 1850   PHURINE 6.0 02/02/2016 1850   GLUCOSEU NEGATIVE 02/02/2016 1850   HGBUR NEGATIVE 02/02/2016 1850   BILIRUBINUR NEGATIVE 02/02/2016 1850   KETONESUR NEGATIVE 02/02/2016 1850   PROTEINUR NEGATIVE 02/02/2016 1850   UROBILINOGEN 1.0 03/16/2011 0917   NITRITE NEGATIVE 02/02/2016 1850   LEUKOCYTESUR TRACE (A)  02/02/2016 1850   Sepsis Labs Invalid input(s): PROCALCITONIN,  WBC,  LACTICIDVEN Microbiology No results found for this or any previous visit (from the past 240 hour(s)).   Time coordinating discharge in minutes: 65  SIGNED:   Calvert Cantor, MD  [facility] 08/25/2017, 10:23 AM Pager   If 7PM-7AM, please contact night-coverage [email or web address] Password [email or web address]

## 2017-11-20 NOTE — H&P (Addendum)
TOTAL HIP REVISION ADMISSION H&P  Patient is admitted for right hip polyethylene versus total hip arthroplasty revision  Subjective:  Chief Complaint: right hip pain  HPI: Mindy Snyder, 67 y.o. female, has a history of pain and functional disability in the right hip due to failed right total hip arthroplasty and patient has failed non-surgical conservative treatments for greater than 12 weeks to include activity modification. The indications for the revision total hip arthroplasty are bearing surface wear leading to  hip instability.  Onset of symptoms was abrupt starting 1 years ago with gradually worsening course since that time.  Prior procedures on the right hip include arthroplasty.  Patient currently rates pain in the right hip at 6 out of 10 with activity.  There is worsening of pain with activity and weight bearing and lateral hip pain. Patient has evidence of significant polyethylene wear; she has almost fully worn through the polyethylene with no associated osteolysis by imaging studies.  This condition presents safety issues increasing the risk of falls. There is no current active infection.  Patient Active Problem List   Diagnosis Date Noted  . Vertigo 08/25/2017  . HLD (hyperlipidemia) 08/24/2017  . Diverticulitis 02/02/2016  . Perforation of sigmoid colon due to diverticulitis 02/02/2016  . Essential hypertension 02/02/2016  . Hypokalemia 02/02/2016  . Leukocytosis 02/02/2016  . Postop Transfusion history 04/06/2011  . Postop Acute blood loss anemia 03/25/2011  . Postop Hyponatremia 03/24/2011  . Osteoarthritis of hip 03/23/2011   Past Medical History:  Diagnosis Date  . Arthritis    pain and oa left hip-plans hip replacement  -- s/p right hip replacement -doing well.  pt has arthritis pain lower back, ankles, wrists and "everywhere"  . Blood transfusion 2002   with previous hip replacement     Past Surgical History:  Procedure Laterality Date  . BREAST SURGERY       benign cyst removed from breast age 67  . JOINT REPLACEMENT  2002    right hip arthroplasty  . TOTAL HIP ARTHROPLASTY  03/23/2011   Procedure: TOTAL HIP ARTHROPLASTY;  Surgeon: Loanne DrillingFrank V Lanetra Hartley, MD;  Location: [facility];  Service: Orthopedics;  Laterality: Left;    No current facility-administered medications for this encounter.    Current Outpatient Medications  Medication Sig Dispense Refill Last Dose  . acetaminophen (TYLENOL) 325 MG tablet Take 2 tablets (650 mg total) by mouth every 6 (six) hours as needed for mild pain (or Fever >/= 101).     Marland Kitchen. atorvastatin (LIPITOR) 40 MG tablet Take 40 mg by mouth daily.  3 08/23/2017 at Unknown time  . ibuprofen (ADVIL,MOTRIN) 200 MG tablet Take 200 mg by mouth daily as needed for mild pain or moderate pain (pain).    Past Week at Unknown time  . lisinopril (PRINIVIL,ZESTRIL) 20 MG tablet Take 20 mg by mouth daily.  0 08/23/2017 at Unknown time  . meclizine (ANTIVERT) 25 MG tablet Take 1 tablet (25 mg total) by mouth 3 (three) times daily as needed for dizziness. 30 tablet 0    Allergies  Allergen Reactions  . Codeine Other (See Comments) and Nausea Only    Upset Stomach    Social History   Tobacco Use  . Smoking status: Former Smoker    Packs/day: 1.00    Years: 25.00    Pack years: 25.00    Types: Cigarettes  . Smokeless tobacco: Never Used  . Tobacco comment: stopped smoking sept 2010  Substance Use Topics  . Alcohol  use: Yes    Comment: glass of wine daily    Family History  Problem Relation Age of Onset  . Hypertension Mother   . Hyperlipidemia Mother   . Hypertension Father   . Diabetes Father   . Hyperlipidemia Father       Review of Systems  Constitutional: Negative for chills and fever.  HENT: Negative for congestion, sore throat and tinnitus.   Eyes: Negative for double vision, photophobia and pain.  Respiratory: Negative for cough, shortness of breath and wheezing.   Cardiovascular: Negative for chest pain,  palpitations and orthopnea.  Gastrointestinal: Negative for heartburn, nausea and vomiting.  Genitourinary: Negative for dysuria, frequency and urgency.  Musculoskeletal: Positive for joint pain.  Neurological: Negative for dizziness, weakness and headaches.  Psychiatric/Behavioral: Negative for depression.    Objective:  Physical Exam  Well nourished and well developed. General: Alert and oriented x3, cooperative and pleasant, no acute distress. Head: normocephalic, atraumatic, neck supple. Eyes: EOMI. Respiratory: breath sounds clear in all fields, no wheezing, rales, or rhonchi. Cardiovascular: Regular rate and rhythm, no murmurs, gallops or rubs.  Abdomen: non-tender to palpation and soft, normoactive bowel sounds. Musculoskeletal: Nonantalgic gait without the use of assisted devices.  Right Hip Exam: ROM: Flexion to 120, Internal Rotation 30, External Rotation 40, and abduction 40 without discomfort. There is slight tenderness over the greater trochanter. There is no pain on provocative testing of the hip.  Left Hip Exam: ROM: is similar without discomfort.  There is no tenderness over the greater trochanter.  There is no pain on provocative testing of the hip. Calves soft and nontender. Motor function intact in LE. Strength 5/5 LE bilaterally. Neuro: Distal pulses 2+. Sensation to light touch intact in LE.  Vital signs in last 24 hours: Blood pressure: 132/82 mmHg    Labs:   Estimated body mass index is 26.26 kg/m as calculated from the following:   Height as of 11/24/16: 5\' 4"  (1.626 m).   Weight as of 08/24/17: 69.4 kg.  Imaging Review:  Plain radiographs demonstrate almost complete polyethylene wear in the right hip. There is no evidence of loosening of the acetabular cup or femoral stem.The bone quality appears to be adequate for age and reported activity level.    Preoperative templating of the joint replacement has been completed, documented, and submitted to  the Operating Room personnel in order to optimize intra-operative equipment management.   Assessment/Plan:  End stage arthritis, right hip(s) with failed previous arthroplasty.  The patient history, physical examination, clinical judgement of the provider and imaging studies are consistent with , previous total hip arthroplasty with significant polyethylene wear. Revision total hip arthroplasty is deemed medically necessary. The treatment options including medical management, injection therapy, arthroscopy and arthroplasty were discussed at length. The risks and benefits of total hip arthroplasty were presented and reviewed. The risks due to aseptic loosening, infection, stiffness, dislocation/subluxation,  thromboembolic complications and other imponderables were discussed.  The patient acknowledged the explanation, agreed to proceed with the plan and consent was signed. Patient is being admitted for inpatient treatment for surgery, pain control, PT, OT, prophylactic antibiotics, VTE prophylaxis, progressive ambulation and ADL's and discharge planning. The patient is planning to be discharged home with HEP versus HHPT.   Therapy Plans: HEP versus HHPT Disposition: Home with family Planned DVT Prophylaxis: Aspirin 325 mg BID DME needed: Dan Humphreys, 3-n-1 PCP: Dr. Caryn Bee Via (medical clearance provided) TXA: IV Allergies: Codeine (nausea) Anesthesia Concerns: Nausea/vomiting  - Patient was instructed on what  medications to stop prior to surgery. - Follow-up visit in 2 weeks with Dr. Wynelle Link - Begin physical therapy following surgery - Pre-operative lab work as pre-surgical testing - Prescriptions will be provided in hospital at time of discharge  Theresa Duty, PA-C Orthopedic Surgery [facility]

## 2017-11-29 ENCOUNTER — Other Ambulatory Visit: Payer: Self-pay

## 2017-11-29 ENCOUNTER — Encounter (HOSPITAL_COMMUNITY)
Admission: RE | Admit: 2017-11-29 | Discharge: 2017-11-29 | Disposition: A | Discharge status: Home / Self Care | Payer: Federal, State, Local not specified - PPO | Source: Ambulatory Visit | Attending: Orthopedic Surgery | Admitting: Orthopedic Surgery

## 2017-11-29 ENCOUNTER — Encounter (HOSPITAL_COMMUNITY): Payer: Self-pay

## 2017-11-29 DIAGNOSIS — Z01812 Encounter for preprocedural laboratory examination: Secondary | ICD-10-CM | POA: Insufficient documentation

## 2017-11-29 HISTORY — DX: Essential (primary) hypertension: I10

## 2017-11-29 HISTORY — DX: Personal history of other specified conditions: Z87.898

## 2017-11-29 HISTORY — DX: Personal history of other diseases of the digestive system: Z87.19

## 2017-11-29 HISTORY — DX: Hyperlipidemia, unspecified: E78.5

## 2017-11-29 HISTORY — DX: Presence of spectacles and contact lenses: Z97.3

## 2017-11-29 HISTORY — DX: Benign paroxysmal vertigo, unspecified ear: H81.10

## 2017-11-29 LAB — COMPREHENSIVE METABOLIC PANEL
ALK PHOS: 65 U/L (ref 38–126)
ALT: 24 U/L (ref 0–44)
AST: 21 U/L (ref 15–41)
Albumin: 4.2 g/dL (ref 3.5–5.0)
Anion gap: 7 (ref 5–15)
BUN: 11 mg/dL (ref 8–23)
CALCIUM: 9.8 mg/dL (ref 8.9–10.3)
CHLORIDE: 106 mmol/L (ref 98–111)
CO2: 29 mmol/L (ref 22–32)
CREATININE: 0.59 mg/dL (ref 0.44–1.00)
GFR calc non Af Amer: >60 mL/min (ref >60)
GLUCOSE: 100 mg/dL — AB (ref 70–99)
Potassium: 5.1 mmol/L (ref 3.5–5.1)
SODIUM: 142 mmol/L (ref 135–145)
Total Bilirubin: 0.9 mg/dL (ref 0.3–1.2)
Total Protein: 6.7 g/dL (ref 6.5–8.1)

## 2017-11-29 LAB — PROTIME-INR
INR: 0.94
Prothrombin Time: 12.5 seconds (ref 11.4–15.2)

## 2017-11-29 LAB — CBC
HCT: 43.1 % (ref 36.0–46.0)
HEMOGLOBIN: 14 g/dL (ref 12.0–15.0)
MCH: 31.5 pg (ref 26.0–34.0)
MCHC: 32.5 g/dL (ref 30.0–36.0)
MCV: 97.1 fL (ref 78.0–100.0)
PLATELETS: 226 10*3/uL (ref 150–400)
RBC: 4.44 MIL/uL (ref 3.87–5.11)
RDW: 14 % (ref 11.5–15.5)
WBC: 6 10*3/uL (ref 4.0–10.5)

## 2017-11-29 LAB — SURGICAL PCR SCREEN
MRSA, PCR: NEGATIVE
STAPHYLOCOCCUS AUREUS: POSITIVE — AB

## 2017-11-29 LAB — APTT: aPTT: 28 seconds (ref 24–36)

## 2017-11-29 NOTE — Progress Notes (Signed)
Positive PCR screen dated 11-29-2017 sent to dr Lequita Halt in epic.

## 2017-11-29 NOTE — Patient Instructions (Addendum)
DESHIA VANDERHOOF Sciver  1950/10/10    Your procedure is scheduled on:    Wednesday 12/06/2017  Report to [facility] Main  Entrance,    Report to admitting at  10:30  AM    Call this number if you have problems the morning of surgery [phone]    Remember: Do not eat food  :After Midnight. May have clear liquids from 12 midnight up until 07:00 am then nothing until after surgery!    CLEAR LIQUID DIET  No creamer or Milk Products!  Foods Allowed                                                                     Foods Excluded  Coffee and tea, regular and decaf                             liquids that you cannot  Plain Jell-O in any flavor                                             see through such as: Fruit ices (not with fruit pulp)                                     milk, soups, orange juice  Iced Popsicles                                    All solid food Carbonated beverages, regular and diet                                    Cranberry, grape and apple juices Sports drinks like Gatorade Lightly seasoned clear broth or consume(fat free) Sugar, honey syrup  Sample Menu Breakfast                                Lunch                                     Supper Cranberry juice                    Beef broth                            Chicken broth Jell-O                                     Grape juice  Apple juice Coffee or tea                        Jell-O                                      Popsicle                                                Coffee or tea                        Coffee or tea  _____________________________________________________________________               BRUSH YOUR TEETH MORNING OF SURGERY AND RINSE YOUR MOUTH OUT, NO CHEWING GUM CANDY OR MINTS.     Take these medicines the morning of surgery with A SIP OF WATER: Atorvastatin (Lipitor)                                You may not have any metal on  your body including hair pins and              piercings                Do not wear jewelry, make-up, lotions, powders or perfumes, deodorant                         Do not wear nail polish.  Do not shave  48 hours prior to surgery.               Do not bring valuables to the hospital. [facility] IS NOT             RESPONSIBLE   FOR VALUABLES.  Contacts, dentures or bridgework may not be worn into surgery.  Leave suitcase in the car. After surgery it may be brought to your room.                  Please read over the following fact sheets you were given: _____________________________________________________________________             [facility] - Preparing for Surgery Before surgery, you can play an important role.  Because skin is not sterile, your skin needs to be as free of germs as possible.  You can reduce the number of germs on your skin by washing with CHG (chlorahexidine gluconate) soap before surgery.  CHG is an antiseptic cleaner which kills germs and bonds with the skin to continue killing germs even after washing. Please DO NOT use if you have an allergy to CHG or antibacterial soaps.  If your skin becomes reddened/irritated stop using the CHG and inform your nurse when you arrive at Short Stay. Do not shave (including legs and underarms) for at least 48 hours prior to the first CHG shower.  You may shave your face/neck. Please follow these instructions carefully:  1.  Shower with CHG Soap the night before surgery and the  morning of Surgery.  2.  If you choose to wash your hair, wash your hair first as usual with your  normal  shampoo.  3.  After you shampoo, rinse your hair and body thoroughly to remove the  shampoo.                            4.  Use CHG as you would any other liquid soap.  You can apply chg directly  to the skin and wash                       Gently with a scrungie or clean washcloth.  5.  Apply the CHG Soap to your body ONLY FROM THE NECK DOWN.   Do  not use on face/ open                           Wound or open sores. Avoid contact with eyes, ears mouth and genitals (private parts).                       Wash face,  Genitals (private parts) with your normal soap.             6.  Wash thoroughly, paying special attention to the area where your surgery  will be performed.  7.  Thoroughly rinse your body with warm water from the neck down.  8.  DO NOT shower/wash with your normal soap after using and rinsing off  the CHG Soap.              9.  Pat yourself dry with a clean towel.            10.  Wear clean pajamas.            11.  Place clean sheets on your bed the night of your first shower and do not  sleep with pets. Day of Surgery : Do not apply any lotions/deodorants the morning of surgery.  Please wear clean clothes to the hospital/surgery center.  FAILURE TO FOLLOW THESE INSTRUCTIONS MAY RESULT IN THE CANCELLATION OF YOUR SURGERY PATIENT SIGNATURE_________________________________  NURSE SIGNATURE__________________________________  ________________________________________________________________________   Rogelia Mire  An incentive spirometer is a tool that can help keep your lungs clear and active. This tool measures how well you are filling your lungs with each breath. Taking long deep breaths may help reverse or decrease the chance of developing breathing (pulmonary) problems (especially infection) following:  A long period of time when you are unable to move or be active. BEFORE THE PROCEDURE   If the spirometer includes an indicator to show your best effort, your nurse or respiratory therapist will set it to a desired goal.  If possible, sit up straight or lean slightly forward. Try not to slouch.  Hold the incentive spirometer in an upright position. INSTRUCTIONS FOR USE  1. Sit on the edge of your bed if possible, or sit up as far as you can in bed or on a chair. 2. Hold the incentive spirometer in an upright  position. 3. Breathe out normally. 4. Place the mouthpiece in your mouth and seal your lips tightly around it. 5. Breathe in slowly and as deeply as possible, raising the piston or the ball toward the top of the column. 6. Hold your breath for 3-5 seconds or for as long as possible. Allow the piston or ball to fall to the bottom of the column. 7. Remove the mouthpiece from your mouth and breathe out normally. 8. Rest  for a few seconds and repeat Steps 1 through 7 at least 10 times every 1-2 hours when you are awake. Take your time and take a few normal breaths between deep breaths. 9. The spirometer may include an indicator to show your best effort. Use the indicator as a goal to work toward during each repetition. 10. After each set of 10 deep breaths, practice coughing to be sure your lungs are clear. If you have an incision (the cut made at the time of surgery), support your incision when coughing by placing a pillow or rolled up towels firmly against it. Once you are able to get out of bed, walk around indoors and cough well. You may stop using the incentive spirometer when instructed by your caregiver.  RISKS AND COMPLICATIONS  Take your time so you do not get dizzy or light-headed.  If you are in pain, you may need to take or ask for pain medication before doing incentive spirometry. It is harder to take a deep breath if you are having pain. AFTER USE  Rest and breathe slowly and easily.  It can be helpful to keep track of a log of your progress. Your caregiver can provide you with a simple table to help with this. If you are using the spirometer at home, follow these instructions: SEEK MEDICAL CARE IF:   You are having difficultly using the spirometer.  You have trouble using the spirometer as often as instructed.  Your pain medication is not giving enough relief while using the spirometer.  You develop fever of 100.5 F (38.1 C) or higher. SEEK IMMEDIATE MEDICAL CARE IF:    You cough up bloody sputum that had not been present before.  You develop fever of 102 F (38.9 C) or greater.  You develop worsening pain at or near the incision site. MAKE SURE YOU:   Understand these instructions.  Will watch your condition.  Will get help right away if you are not doing well or get worse. Document Released: 06/27/2006 Document Revised: 05/09/2011 Document Reviewed: 08/28/2006 [employer] Patient Information 2014 [employer], [place].   ________________________________________________________________________  WHAT IS A BLOOD TRANSFUSION? Blood Transfusion Information  A transfusion is the replacement of blood or some of its parts. Blood is made up of multiple cells which provide different functions.  Red blood cells carry oxygen and are used for blood loss replacement.  White blood cells fight against infection.  Platelets control bleeding.  Plasma helps clot blood.  Other blood products are available for specialized needs, such as hemophilia or other clotting disorders. BEFORE THE TRANSFUSION  Who gives blood for transfusions?   Healthy volunteers who are fully evaluated to make sure their blood is safe. This is blood bank blood. Transfusion therapy is the safest it has ever been in the practice of medicine. Before blood is taken from a donor, a complete history is taken to make sure that person has no history of diseases nor engages in risky social behavior (examples are intravenous drug use or sexual activity with multiple partners). The donor's travel history is screened to minimize risk of transmitting infections, such as malaria. The donated blood is tested for signs of infectious diseases, such as HIV and hepatitis. The blood is then tested to be sure it is compatible with you in order to minimize the chance of a transfusion reaction. If you or a relative donates blood, this is often done in anticipation of surgery and is not appropriate for emergency  situations. It takes many  days to process the donated blood. RISKS AND COMPLICATIONS Although transfusion therapy is very safe and saves many lives, the main dangers of transfusion include:   Getting an infectious disease.  Developing a transfusion reaction. This is an allergic reaction to something in the blood you were given. Every precaution is taken to prevent this. The decision to have a blood transfusion has been considered carefully by your caregiver before blood is given. Blood is not given unless the benefits outweigh the risks. AFTER THE TRANSFUSION  Right after receiving a blood transfusion, you will usually feel much better and more energetic. This is especially true if your red blood cells have gotten low (anemic). The transfusion raises the level of the red blood cells which carry oxygen, and this usually causes an energy increase.  The nurse administering the transfusion will monitor you carefully for complications. HOME CARE INSTRUCTIONS  No special instructions are needed after a transfusion. You may find your energy is better. Speak with your caregiver about any limitations on activity for underlying diseases you may have. SEEK MEDICAL CARE IF:   Your condition is not improving after your transfusion.  You develop redness or irritation at the intravenous (IV) site. SEEK IMMEDIATE MEDICAL CARE IF:  Any of the following symptoms occur over the next 12 hours:  Shaking chills.  You have a temperature by mouth above 102 F (38.9 C), not controlled by medicine.  Chest, back, or muscle pain.  People around you feel you are not acting correctly or are confused.  Shortness of breath or difficulty breathing.  Dizziness and fainting.  You get a rash or develop hives.  You have a decrease in urine output.  Your urine turns a dark color or changes to pink, red, or brown. Any of the following symptoms occur over the next 10 days:  You have a temperature by mouth above  102 F (38.9 C), not controlled by medicine.  Shortness of breath.  Weakness after normal activity.  The white part of the eye turns yellow (jaundice).  You have a decrease in the amount of urine or are urinating less often.  Your urine turns a dark color or changes to pink, red, or brown. Document Released: 02/12/2000 Document Revised: 05/09/2011 Document Reviewed: 10/01/2007 [facility] Patient Information 2014 [place], [place].  _______________________________________________________________________

## 2017-11-29 NOTE — Progress Notes (Signed)
08/24/2017- noted in Epic- EKG  11/24/2016- noted in Epic-Office visit with Dr. Eldridge Dace.

## 2017-12-06 ENCOUNTER — Inpatient Hospital Stay (HOSPITAL_COMMUNITY): Payer: Medicare Other | Admitting: Certified Registered Nurse Anesthetist

## 2017-12-06 ENCOUNTER — Inpatient Hospital Stay (HOSPITAL_COMMUNITY): Payer: Medicare Other

## 2017-12-06 ENCOUNTER — Inpatient Hospital Stay (HOSPITAL_COMMUNITY)
Admission: RE | Admit: 2017-12-06 | Discharge: 2017-12-07 | DRG: 468 | Disposition: A | Discharge status: Home Health | Payer: Medicare Other | Source: Ambulatory Visit | Attending: Orthopedic Surgery | Admitting: Orthopedic Surgery

## 2017-12-06 ENCOUNTER — Encounter (HOSPITAL_COMMUNITY)
Admission: RE | Disposition: A | Discharge status: Home Health | Payer: Self-pay | Source: Ambulatory Visit | Attending: Orthopedic Surgery

## 2017-12-06 ENCOUNTER — Other Ambulatory Visit: Payer: Self-pay

## 2017-12-06 ENCOUNTER — Encounter (HOSPITAL_COMMUNITY): Payer: Self-pay | Admitting: General Practice

## 2017-12-06 DIAGNOSIS — Z87891 Personal history of nicotine dependence: Secondary | ICD-10-CM

## 2017-12-06 DIAGNOSIS — T84090A Other mechanical complication of internal right hip prosthesis, initial encounter: Secondary | ICD-10-CM | POA: Diagnosis present

## 2017-12-06 DIAGNOSIS — Z9181 History of falling: Secondary | ICD-10-CM | POA: Diagnosis not present

## 2017-12-06 DIAGNOSIS — I10 Essential (primary) hypertension: Secondary | ICD-10-CM | POA: Diagnosis present

## 2017-12-06 DIAGNOSIS — Z96643 Presence of artificial hip joint, bilateral: Secondary | ICD-10-CM | POA: Diagnosis present

## 2017-12-06 DIAGNOSIS — Z23 Encounter for immunization: Secondary | ICD-10-CM | POA: Diagnosis present

## 2017-12-06 DIAGNOSIS — Z885 Allergy status to narcotic agent status: Secondary | ICD-10-CM | POA: Diagnosis not present

## 2017-12-06 DIAGNOSIS — Z79899 Other long term (current) drug therapy: Secondary | ICD-10-CM | POA: Diagnosis not present

## 2017-12-06 DIAGNOSIS — M1990 Unspecified osteoarthritis, unspecified site: Secondary | ICD-10-CM | POA: Diagnosis present

## 2017-12-06 DIAGNOSIS — E785 Hyperlipidemia, unspecified: Secondary | ICD-10-CM | POA: Diagnosis present

## 2017-12-06 DIAGNOSIS — T84018A Broken internal joint prosthesis, other site, initial encounter: Secondary | ICD-10-CM

## 2017-12-06 DIAGNOSIS — H8110 Benign paroxysmal vertigo, unspecified ear: Secondary | ICD-10-CM | POA: Diagnosis present

## 2017-12-06 DIAGNOSIS — Z96649 Presence of unspecified artificial hip joint: Secondary | ICD-10-CM

## 2017-12-06 DIAGNOSIS — Y792 Prosthetic and other implants, materials and accessory orthopedic devices associated with adverse incidents: Secondary | ICD-10-CM | POA: Diagnosis not present

## 2017-12-06 HISTORY — PX: TOTAL HIP REVISION: SHX763

## 2017-12-06 LAB — TYPE AND SCREEN
ABO/RH(D): A POS
Antibody Screen: NEGATIVE

## 2017-12-06 SURGERY — TOTAL HIP REVISION
Anesthesia: Spinal | Site: Hip | Laterality: Right

## 2017-12-06 MED ORDER — ONDANSETRON HCL 4 MG/2ML IJ SOLN
INTRAMUSCULAR | Status: DC | PRN
  Administered 2017-12-06: 4 mg via INTRAVENOUS

## 2017-12-06 MED ORDER — MIDAZOLAM HCL 2 MG/2ML IJ SOLN
INTRAMUSCULAR | Status: AC
  Filled 2017-12-06: qty 2

## 2017-12-06 MED ORDER — DOCUSATE SODIUM 100 MG PO CAPS
100.0000 mg | ORAL_CAPSULE | Freq: Two times a day (BID) | ORAL | Status: DC
  Administered 2017-12-06 – 2017-12-07 (×2): 100 mg via ORAL
  Filled 2017-12-06 (×2): qty 1

## 2017-12-06 MED ORDER — SODIUM CHLORIDE 0.9 % IV SOLN
INTRAVENOUS | Status: DC
  Administered 2017-12-06: 22:00:00 via INTRAVENOUS
  Administered 2017-12-06: 1000 mL via INTRAVENOUS

## 2017-12-06 MED ORDER — 0.9 % SODIUM CHLORIDE (POUR BTL) OPTIME
TOPICAL | Status: DC | PRN
  Administered 2017-12-06: 1000 mL

## 2017-12-06 MED ORDER — TRANEXAMIC ACID-NACL 1000-0.7 MG/100ML-% IV SOLN
1000.0000 mg | Freq: Once | INTRAVENOUS | Status: AC
  Administered 2017-12-06: 1000 mg via INTRAVENOUS
  Filled 2017-12-06: qty 100

## 2017-12-06 MED ORDER — CHLORHEXIDINE GLUCONATE 4 % EX LIQD: 60.0000 mL | Freq: Once | CUTANEOUS | Status: DC

## 2017-12-06 MED ORDER — ONDANSETRON HCL 4 MG PO TABS: 4.0000 mg | ORAL_TABLET | Freq: Four times a day (QID) | ORAL | Status: DC | PRN

## 2017-12-06 MED ORDER — DIPHENHYDRAMINE HCL 12.5 MG/5ML PO ELIX: 12.5000 mg | ORAL_SOLUTION | ORAL | Status: DC | PRN

## 2017-12-06 MED ORDER — PHENOL 1.4 % MT LIQD: 1.0000 | OROMUCOSAL | Status: DC | PRN

## 2017-12-06 MED ORDER — MECLIZINE HCL 25 MG PO TABS: 25.0000 mg | ORAL_TABLET | Freq: Three times a day (TID) | ORAL | Status: DC | PRN

## 2017-12-06 MED ORDER — ACETAMINOPHEN 10 MG/ML IV SOLN
1000.0000 mg | Freq: Four times a day (QID) | INTRAVENOUS | Status: DC
  Administered 2017-12-06: 1000 mg via INTRAVENOUS
  Filled 2017-12-06: qty 100

## 2017-12-06 MED ORDER — HYDROCODONE-ACETAMINOPHEN 5-325 MG PO TABS
1.0000 | ORAL_TABLET | ORAL | Status: DC | PRN
  Administered 2017-12-06: 2 via ORAL
  Administered 2017-12-06 (×2): 1 via ORAL
  Administered 2017-12-07: 2 via ORAL
  Administered 2017-12-07: 1 via ORAL
  Filled 2017-12-06: qty 1
  Filled 2017-12-06: qty 2
  Filled 2017-12-06: qty 1
  Filled 2017-12-06: qty 2
  Filled 2017-12-06: qty 1

## 2017-12-06 MED ORDER — STERILE WATER FOR IRRIGATION IR SOLN
Status: DC | PRN
  Administered 2017-12-06: 2000 mL

## 2017-12-06 MED ORDER — ONDANSETRON HCL 4 MG/2ML IJ SOLN: 4.0000 mg | Freq: Once | INTRAMUSCULAR | Status: DC | PRN

## 2017-12-06 MED ORDER — METHOCARBAMOL 500 MG PO TABS
500.0000 mg | ORAL_TABLET | Freq: Four times a day (QID) | ORAL | Status: DC | PRN
  Administered 2017-12-06: 500 mg via ORAL
  Filled 2017-12-06: qty 1

## 2017-12-06 MED ORDER — CEFAZOLIN SODIUM-DEXTROSE 2-4 GM/100ML-% IV SOLN
2.0000 g | INTRAVENOUS | Status: AC
  Administered 2017-12-06: 2 g via INTRAVENOUS
  Filled 2017-12-06: qty 100

## 2017-12-06 MED ORDER — TRANEXAMIC ACID 1000 MG/10ML IV SOLN
1000.0000 mg | INTRAVENOUS | Status: AC
  Administered 2017-12-06: 1000 mg via INTRAVENOUS
  Filled 2017-12-06: qty 10

## 2017-12-06 MED ORDER — ACETAMINOPHEN 500 MG PO TABS
500.0000 mg | ORAL_TABLET | Freq: Four times a day (QID) | ORAL | Status: DC
  Administered 2017-12-06 – 2017-12-07 (×3): 500 mg via ORAL
  Filled 2017-12-06 (×3): qty 1

## 2017-12-06 MED ORDER — METOCLOPRAMIDE HCL 5 MG/ML IJ SOLN: 5.0000 mg | Freq: Three times a day (TID) | INTRAMUSCULAR | Status: DC | PRN

## 2017-12-06 MED ORDER — EPHEDRINE 5 MG/ML INJ
INTRAVENOUS | Status: AC
  Filled 2017-12-06: qty 10

## 2017-12-06 MED ORDER — FLEET ENEMA 7-19 GM/118ML RE ENEM: 1.0000 | ENEMA | Freq: Once | RECTAL | Status: DC | PRN

## 2017-12-06 MED ORDER — POLYETHYLENE GLYCOL 3350 17 G PO PACK
17.0000 g | PACK | Freq: Every day | ORAL | Status: DC | PRN
  Administered 2017-12-07: 17 g via ORAL
  Filled 2017-12-06: qty 1

## 2017-12-06 MED ORDER — FENTANYL CITRATE (PF) 100 MCG/2ML IJ SOLN
INTRAMUSCULAR | Status: AC
  Filled 2017-12-06: qty 2

## 2017-12-06 MED ORDER — ACETAMINOPHEN 325 MG PO TABS: 325.0000 mg | ORAL_TABLET | Freq: Four times a day (QID) | ORAL | Status: DC | PRN

## 2017-12-06 MED ORDER — DEXAMETHASONE SODIUM PHOSPHATE 10 MG/ML IJ SOLN
8.0000 mg | Freq: Once | INTRAMUSCULAR | Status: AC
  Administered 2017-12-06: 8 mg via INTRAVENOUS

## 2017-12-06 MED ORDER — METHOCARBAMOL 500 MG IVPB - SIMPLE MED
500.0000 mg | Freq: Four times a day (QID) | INTRAVENOUS | Status: DC | PRN
  Administered 2017-12-06: 500 mg via INTRAVENOUS
  Filled 2017-12-06: qty 50

## 2017-12-06 MED ORDER — LACTATED RINGERS IV SOLN
INTRAVENOUS | Status: DC
  Administered 2017-12-06 (×2): via INTRAVENOUS

## 2017-12-06 MED ORDER — PROPOFOL 10 MG/ML IV BOLUS
INTRAVENOUS | Status: AC
  Filled 2017-12-06: qty 40

## 2017-12-06 MED ORDER — CEFAZOLIN SODIUM-DEXTROSE 1-4 GM/50ML-% IV SOLN
1.0000 g | Freq: Four times a day (QID) | INTRAVENOUS | Status: AC
  Administered 2017-12-06 – 2017-12-07 (×2): 1 g via INTRAVENOUS
  Filled 2017-12-06 (×2): qty 50

## 2017-12-06 MED ORDER — ONDANSETRON HCL 4 MG/2ML IJ SOLN: 4.0000 mg | Freq: Four times a day (QID) | INTRAMUSCULAR | Status: DC | PRN

## 2017-12-06 MED ORDER — ONDANSETRON HCL 4 MG/2ML IJ SOLN
INTRAMUSCULAR | Status: AC
  Filled 2017-12-06: qty 2

## 2017-12-06 MED ORDER — METHOCARBAMOL 500 MG IVPB - SIMPLE MED
INTRAVENOUS | Status: AC
  Administered 2017-12-06: 500 mg via INTRAVENOUS
  Filled 2017-12-06: qty 50

## 2017-12-06 MED ORDER — ASPIRIN EC 325 MG PO TBEC
325.0000 mg | DELAYED_RELEASE_TABLET | Freq: Two times a day (BID) | ORAL | Status: DC
  Administered 2017-12-07: 325 mg via ORAL
  Filled 2017-12-06: qty 1

## 2017-12-06 MED ORDER — PHENYLEPHRINE 40 MCG/ML (10ML) SYRINGE FOR IV PUSH (FOR BLOOD PRESSURE SUPPORT)
PREFILLED_SYRINGE | INTRAVENOUS | Status: DC | PRN
  Administered 2017-12-06: 80 ug via INTRAVENOUS
  Administered 2017-12-06 (×2): 40 ug via INTRAVENOUS

## 2017-12-06 MED ORDER — MENTHOL 3 MG MT LOZG: 1.0000 | LOZENGE | OROMUCOSAL | Status: DC | PRN

## 2017-12-06 MED ORDER — MORPHINE SULFATE (PF) 2 MG/ML IV SOLN: 0.5000 mg | INTRAVENOUS | Status: DC | PRN

## 2017-12-06 MED ORDER — BISACODYL 10 MG RE SUPP: 10.0000 mg | Freq: Every day | RECTAL | Status: DC | PRN

## 2017-12-06 MED ORDER — PROPOFOL 500 MG/50ML IV EMUL
INTRAVENOUS | Status: DC | PRN
  Administered 2017-12-06: 25 ug/kg/min via INTRAVENOUS

## 2017-12-06 MED ORDER — ATORVASTATIN CALCIUM 40 MG PO TABS
40.0000 mg | ORAL_TABLET | Freq: Every morning | ORAL | Status: DC
  Administered 2017-12-07: 40 mg via ORAL
  Filled 2017-12-06: qty 1

## 2017-12-06 MED ORDER — METOCLOPRAMIDE HCL 5 MG PO TABS: 5.0000 mg | ORAL_TABLET | Freq: Three times a day (TID) | ORAL | Status: DC | PRN

## 2017-12-06 MED ORDER — FENTANYL CITRATE (PF) 100 MCG/2ML IJ SOLN
25.0000 ug | INTRAMUSCULAR | Status: DC | PRN
  Administered 2017-12-06: 50 ug via INTRAVENOUS

## 2017-12-06 MED ORDER — TRAMADOL HCL 50 MG PO TABS
50.0000 mg | ORAL_TABLET | Freq: Four times a day (QID) | ORAL | Status: DC | PRN
  Administered 2017-12-06: 100 mg via ORAL
  Filled 2017-12-06: qty 2

## 2017-12-06 MED ORDER — EPHEDRINE SULFATE-NACL 50-0.9 MG/10ML-% IV SOSY
PREFILLED_SYRINGE | INTRAVENOUS | Status: DC | PRN
  Administered 2017-12-06: 10 mg via INTRAVENOUS

## 2017-12-06 MED ORDER — DEXAMETHASONE SODIUM PHOSPHATE 10 MG/ML IJ SOLN
INTRAMUSCULAR | Status: AC
  Filled 2017-12-06: qty 1

## 2017-12-06 MED ORDER — PHENYLEPHRINE 40 MCG/ML (10ML) SYRINGE FOR IV PUSH (FOR BLOOD PRESSURE SUPPORT)
PREFILLED_SYRINGE | INTRAVENOUS | Status: AC
  Filled 2017-12-06: qty 10

## 2017-12-06 MED ORDER — INFLUENZA VAC SPLIT HIGH-DOSE 0.5 ML IM SUSY
0.5000 mL | PREFILLED_SYRINGE | INTRAMUSCULAR | Status: AC
  Administered 2017-12-07: 0.5 mL via INTRAMUSCULAR
  Filled 2017-12-06: qty 0.5

## 2017-12-06 MED ORDER — BUPIVACAINE IN DEXTROSE 0.75-8.25 % IT SOLN
INTRATHECAL | Status: DC | PRN
  Administered 2017-12-06: 2 mL via INTRATHECAL

## 2017-12-06 MED ORDER — MIDAZOLAM HCL 5 MG/5ML IJ SOLN
INTRAMUSCULAR | Status: DC | PRN
  Administered 2017-12-06: 1 mg via INTRAVENOUS

## 2017-12-06 MED ORDER — DEXAMETHASONE SODIUM PHOSPHATE 10 MG/ML IJ SOLN
10.0000 mg | Freq: Once | INTRAMUSCULAR | Status: AC
  Administered 2017-12-07: 10 mg via INTRAVENOUS
  Filled 2017-12-06: qty 1

## 2017-12-06 SURGICAL SUPPLY — 71 items
ARTICULEZE HEAD (Hips) ×3 IMPLANT
BAG DECANTER FOR FLEXI CONT (MISCELLANEOUS) ×3 IMPLANT
BAG SPEC THK2 15X12 ZIP CLS (MISCELLANEOUS) ×2
BAG ZIPLOCK 12X15 (MISCELLANEOUS) ×6 IMPLANT
BIT DRILL 2.4X128 (BIT) ×1 IMPLANT
BIT DRILL 2.4X128MM (BIT) ×1
BIT DRILL 2.8X128 (BIT) ×2 IMPLANT
BIT DRILL 2.8X128MM (BIT) ×1
BLADE EXTENDED COATED 6.5IN (ELECTRODE) ×3 IMPLANT
BLADE SAW SAG 73X25 THK (BLADE)
BLADE SAW SGTL 73X25 THK (BLADE) IMPLANT
BRUSH FEMORAL CANAL (MISCELLANEOUS) IMPLANT
CLOSURE WOUND 1/2 X4 (GAUZE/BANDAGES/DRESSINGS) ×2
COVER SURGICAL LIGHT HANDLE (MISCELLANEOUS) ×3 IMPLANT
COVER WAND RF STERILE (DRAPES) IMPLANT
DRAPE INCISE IOBAN 66X45 STRL (DRAPES) ×3 IMPLANT
DRAPE ORTHO SPLIT 77X108 STRL (DRAPES) ×6
DRAPE POUCH INSTRU U-SHP 10X18 (DRAPES) ×3 IMPLANT
DRAPE SURG ORHT 6 SPLT 77X108 (DRAPES) ×2 IMPLANT
DRAPE U-SHAPE 47X51 STRL (DRAPES) ×3 IMPLANT
DRSG ADAPTIC 3X8 NADH LF (GAUZE/BANDAGES/DRESSINGS) ×2 IMPLANT
DRSG EMULSION OIL 3X16 NADH (GAUZE/BANDAGES/DRESSINGS) ×3 IMPLANT
DRSG MEPILEX BORDER 4X4 (GAUZE/BANDAGES/DRESSINGS) ×4 IMPLANT
DRSG MEPILEX BORDER 4X8 (GAUZE/BANDAGES/DRESSINGS) ×3 IMPLANT
DURAPREP 26ML APPLICATOR (WOUND CARE) ×3 IMPLANT
ELECT REM PT RETURN 15FT ADLT (MISCELLANEOUS) ×3 IMPLANT
EVACUATOR 1/8 PVC DRAIN (DRAIN) ×3 IMPLANT
GAUZE SPONGE 4X4 12PLY STRL (GAUZE/BANDAGES/DRESSINGS) ×3 IMPLANT
GLOVE BIO SURGEON STRL SZ 6.5 (GLOVE) ×2 IMPLANT
GLOVE BIO SURGEON STRL SZ8 (GLOVE) ×3 IMPLANT
GLOVE BIO SURGEONS STRL SZ 6.5 (GLOVE) ×1
GLOVE BIOGEL PI IND STRL 7.0 (GLOVE) ×3 IMPLANT
GLOVE BIOGEL PI INDICATOR 7.0 (GLOVE) ×6
GOWN STRL REUS W/TWL LRG LVL3 (GOWN DISPOSABLE) ×3 IMPLANT
GOWN STRL REUS W/TWL XL LVL3 (GOWN DISPOSABLE) ×3 IMPLANT
HANDPIECE INTERPULSE COAX TIP (DISPOSABLE)
HEAD ARTICULEZE (Hips) IMPLANT
IMMOBILIZER KNEE 20 (SOFTGOODS)
IMMOBILIZER KNEE 20 THIGH 36 (SOFTGOODS) IMPLANT
LINER ACETAB 32X54 (Hips) ×2 IMPLANT
MANIFOLD NEPTUNE II (INSTRUMENTS) ×3 IMPLANT
MARKER SKIN DUAL TIP RULER LAB (MISCELLANEOUS) ×3 IMPLANT
NDL SAFETY ECLIPSE 18X1.5 (NEEDLE) ×1 IMPLANT
NEEDLE HYPO 18GX1.5 SHARP (NEEDLE) ×3
NS IRRIG 1000ML POUR BTL (IV SOLUTION) ×3 IMPLANT
PACK TOTAL JOINT WL LF (CUSTOM PROCEDURE TRAY) ×3 IMPLANT
PASSER SUT SWANSON 36MM LOOP (INSTRUMENTS) ×2 IMPLANT
POSITIONER SURGICAL ARM (MISCELLANEOUS) ×3 IMPLANT
PRESSURIZER FEMORAL UNIV (MISCELLANEOUS) IMPLANT
RING LOCK ACET OD 54 (Hips) ×2 IMPLANT
SET HNDPC FAN SPRY TIP SCT (DISPOSABLE) IMPLANT
SPONGE LAP 18X18 RF (DISPOSABLE) ×3 IMPLANT
STAPLER VISISTAT 35W (STAPLE) IMPLANT
STRIP CLOSURE SKIN 1/2X4 (GAUZE/BANDAGES/DRESSINGS) ×2 IMPLANT
SUCTION FRAZIER HANDLE 12FR (TUBING) ×2
SUCTION TUBE FRAZIER 12FR DISP (TUBING) ×1 IMPLANT
SUT ETHIBOND NAB CT1 #1 30IN (SUTURE) ×4 IMPLANT
SUT STRATAFIX 0 PDS 27 VIOLET (SUTURE) ×3
SUT VIC AB 1 CT1 27 (SUTURE) ×6
SUT VIC AB 1 CT1 27XBRD ANTBC (SUTURE) ×3 IMPLANT
SUT VIC AB 2-0 CT1 27 (SUTURE) ×6
SUT VIC AB 2-0 CT1 TAPERPNT 27 (SUTURE) ×3 IMPLANT
SUTURE STRATFX 0 PDS 27 VIOLET (SUTURE) ×1 IMPLANT
SWAB COLLECTION DEVICE MRSA (MISCELLANEOUS) IMPLANT
SWAB CULTURE ESWAB REG 1ML (MISCELLANEOUS) IMPLANT
SYR 50ML LL SCALE MARK (SYRINGE) ×3 IMPLANT
TOWEL OR 17X26 10 PK STRL BLUE (TOWEL DISPOSABLE) ×6 IMPLANT
TOWER CARTRIDGE SMART MIX (DISPOSABLE) IMPLANT
TRAY FOLEY CATH 14FR (SET/KITS/TRAYS/PACK) ×2 IMPLANT
WATER STERILE IRR 1000ML POUR (IV SOLUTION) ×6 IMPLANT
YANKAUER SUCT BULB TIP 10FT TU (MISCELLANEOUS) ×3 IMPLANT

## 2017-12-06 NOTE — Anesthesia Preprocedure Evaluation (Addendum)
Anesthesia Evaluation  Patient identified by MRN, date of birth, ID band Patient awake    Reviewed: Allergy & Precautions, NPO status , Patient's Chart, lab work & pertinent test results  Airway Mallampati: II  TM Distance: >3 FB Neck ROM: Full    Dental  (+) Teeth Intact, Dental Advisory Given,    Pulmonary former smoker,    Pulmonary exam normal breath sounds clear to auscultation       Cardiovascular hypertension, Pt. on medications (-) angina(-) CAD, (-) Past MI, (-) Cardiac Stents and (-) CABG Normal cardiovascular exam Rhythm:Regular Rate:Normal     Neuro/Psych negative neurological ROS  negative psych ROS   GI/Hepatic negative GI ROS, Neg liver ROS,   Endo/Other  negative endocrine ROS  Renal/GU negative Renal ROS     Musculoskeletal  (+) Arthritis , Osteoarthritis,  failed right total hip arthroplasty   Abdominal   Peds  Hematology negative hematology ROS (+) Plt 226k   Anesthesia Other Findings Day of surgery medications reviewed with the patient.  Reproductive/Obstetrics                            Anesthesia Physical Anesthesia Plan  ASA: II  Anesthesia Plan: Spinal   Post-op Pain Management:    Induction: Intravenous  PONV Risk Score and Plan: 2 and Propofol infusion, Treatment may vary due to age or medical condition and Ondansetron  Airway Management Planned: Natural Airway and Simple Face Mask  Additional Equipment:   Intra-op Plan:   Post-operative Plan:   Informed Consent: I have reviewed the patients History and Physical, chart, labs and discussed the procedure including the risks, benefits and alternatives for the proposed anesthesia with the patient or authorized representative who has indicated his/her understanding and acceptance.   Dental advisory given  Plan Discussed with: CRNA, Anesthesiologist and Surgeon  Anesthesia Plan Comments:          Anesthesia Quick Evaluation

## 2017-12-06 NOTE — Op Note (Signed)
NAME: Mindy Snyder, Mindy Snyder MEDICAL RECORD ZO:[id number] ACCOUNT [id number] DATE OF BIRTH:[birth year] FACILITY: [facility] LOCATION: [facility] PHYSICIAN:Kambrea Carrasco Dulcy Fanny, MD  OPERATIVE REPORT  DATE OF PROCEDURE:  12/06/2017  PREOPERATIVE DIAGNOSIS:  Failed right total hip arthroplasty.  POSTOPERATIVE DIAGNOSIS:  Failed right total hip arthroplasty.  PROCEDURE:  Right femoral head and polyethylene liner revision.  SURGEON:  Ollen Gross, MD  ASSISTANT:  Arther Abbott, PA-C  ANESTHESIA:  Spinal.  ESTIMATED BLOOD LOSS:  50.  DRAINS:  Hemovac x1.  COMPLICATIONS:  None.  CONDITION:  Stable to recovery.  BRIEF CLINICAL NOTE:  The patient a 67 year old female who had a right total hip arthroplasty done in 2003.  She recently has developed increasing pain in the hip.  She had x-ray showing a near complete wear through of her acetabular polyethylene.  She  had a bone scan which showed no evidence of any component loosening.  She presents now for acetabular versus total hip arthroplasty revision.  PROCEDURE IN DETAIL:  After successful administration of a spinal anesthetic, the patient was placed in the left lateral decubitus position with the right side up and held with a hip positioner.  Right lower extremity was isolated from the perineum with  plastic drapes and prepped and draped in the usual sterile fashion.  Short posterolateral incision was made with a 10 blade through subcutaneous tissue to the level of the fascia lata, which was incised in line with the skin incision.  Sciatic nerve was  palpated and protected.  Posterior pseudocapsule was excised off of the femur and there is evidence of a tremendous amount of polyethylene wear, debris present in the joint.  This is sequentially debrided back to normal appearing tissue.  The hip was  then dislocated.  Femoral head was removed from the femoral neck.  The femoral components in good position and is well fixed.  The femur was then  translated anteriorly to gain acetabular exposure.  Acetabular retractors were placed.  The polyethylene liner had significant wear to the point where it is almost completely worn through superiorly.  The liner was disengaged from the acetabular shell and removed.  The locking ring was also taken out of the shell.  The shell was in a  significant amount of abduction close to 60 degrees and neutral version.  I elected to trial an acetabular liner and checked for stability and if it was not stable, I was going to revise the entire shell.  If it is stable, then we can just do the  polyethylene revision.  Her acetabular shell was a 54 mm Duraloc.  It was extremely well fixed.  I placed a trial 36 mm, +4,  10-degree liner with a 10 degree elevation being about the 11 o'clock position.  We placed a 36+1.5 head and that reduced it  relatively easily, so I went to 36.5 head which had more appropriate soft tissue tension.  The stability was excellent with full extension, full external rotation, 70 degrees of flexion, 40 degrees of adduction and 90 degrees of internal rotation, then  90 degrees of flexion and 70 degrees of internal rotation.  By placing the right leg on top of the left, it was felt that the leg lengths were equal.  Hip was then dislocated and the trial components were removed.  The permanent 36 mm, +4, 10-degree  liner was then placed with the elevated liner at the 11 o'clock position.  The new locking ring was placed first and then the liner was locked  into the acetabular shell.  A 36+5 metal head was placed and the hip was reduced with the same stability  parameters.  The wound was then copiously irrigated with saline solution.  The posterior pseudocapsule was then reattached to the femur through drill holes using Ethibond suture.  We had a very good posterior repair.  The fascia lata was closed over a  Hemovac drain with a running 0 Stratafix suture.  Subcutaneous was closed in 2 layers with  interrupted 2-0 Vicryl and subcuticular running 4-0 Monocryl.  The incision was cleaned and dried and Steri-Strips and a sterile dressing applied.    She was then placed into a knee immobilizer, awakened and transported to recovery in stable condition.  AN/NUANCE  D:12/06/2017 T:12/06/2017 JOB:[id number]

## 2017-12-06 NOTE — Anesthesia Procedure Notes (Signed)
Procedure Name: MAC Date/Time: 12/06/2017 12:25 PM Performed by: West Pugh, CRNA Pre-anesthesia Checklist: Patient identified, Emergency Drugs available, Suction available, Patient being monitored and Timeout performed Patient Re-evaluated:Patient Re-evaluated prior to induction Oxygen Delivery Method: Simple face mask Preoxygenation: Pre-oxygenation with 100% oxygen Induction Type: IV induction Number of attempts: 1 Placement Confirmation: positive ETCO2 Dental Injury: Teeth and Oropharynx as per pre-operative assessment

## 2017-12-06 NOTE — Brief Op Note (Signed)
12/06/2017  1:47 PM  PATIENT:  Alethia Berthold Sciver  67 y.o. female  PRE-OPERATIVE DIAGNOSIS:  failed right total hip arthroplasty  POST-OPERATIVE DIAGNOSIS:  failed right total hip arthroplasty  PROCEDURE:  Procedure(s): Right hip polyethylene (Right)  SURGEON:  Surgeon(s) and Role:    Ollen Gross, MD - Primary  PHYSICIAN ASSISTANT:   ASSISTANTS: Arther Abbott, PA-C   ANESTHESIA:   spinal  EBL:  50 mL   BLOOD ADMINISTERED:none  DRAINS: (medium ) Hemovact drain(s) in the right hip with  Suction Open   LOCAL MEDICATIONS USED:  NONE  COUNTS:  YES  TOURNIQUET:  * No tourniquets in log *  DICTATION: .Other Dictation: Dictation Number [id number]  PLAN OF CARE: Admit to inpatient   PATIENT DISPOSITION:  PACU - hemodynamically stable.

## 2017-12-06 NOTE — Discharge Instructions (Signed)
Dr. Ollen Gross Total Joint Specialist [facility] [address] [place], [place] [address] [phone]  POSTERIOR TOTAL HIP POLYETHYLENE REVISIONPOSTOPERATIVE DIRECTIONS  Hip Rehabilitation, Guidelines Following Surgery  The results of a hip operation are greatly improved after range of motion and muscle strengthening exercises. Follow all safety measures which are given to protect your hip. If any of these exercises cause increased pain or swelling in your joint, decrease the amount until you are comfortable again. Then slowly increase the exercises. Call your caregiver if you have problems or questions.   HOME CARE INSTRUCTIONS   Remove items at home which could result in a fall. This includes throw rugs or furniture in walking pathways.   ICE to the affected hip every three hours for 30 minutes at a time and then as needed for pain and swelling.  Continue to use ice on the hip for pain and swelling from surgery. You may notice swelling that will progress down to the foot and ankle.  This is normal after surgery.  Elevate the leg when you are not up walking on it.    Continue to use the breathing machine which will help keep your temperature down.  It is common for your temperature to cycle up and down following surgery, especially at night when you are not up moving around and exerting yourself.  The breathing machine keeps your lungs expanded and your temperature down.  DIET You may resume your previous home diet once your are discharged from the hospital.  DRESSING / WOUND CARE / SHOWERING You may shower 3 days after surgery, but keep the wounds dry during showering.  You may use an occlusive plastic wrap (Press'n Seal for example), NO SOAKING/SUBMERGING IN THE BATHTUB.  If the bandage gets wet, change with a clean dry gauze.  If the incision gets wet, pat the wound dry with a clean towel. You may start showering once you are discharged home but do not submerge the  incision under water. Just pat the incision dry and apply a dry gauze dressing on daily. Change the surgical dressing daily and reapply a dry dressing each time.  ACTIVITY Walk with your walker as instructed. Use walker as long as suggested by your caregivers. Avoid periods of inactivity such as sitting longer than an hour when not asleep. This helps prevent blood clots.  You may resume a sexual relationship in one month or when given the OK by your doctor.  You may return to work once you are cleared by your doctor.  Do not drive a car for 6 weeks or until released by you surgeon.  Do not drive while taking narcotics.  WEIGHT BEARING Weight bearing as tolerated with assist device (walker, cane, etc) as directed, use it as long as suggested by your surgeon or therapist, typically at least 4-6 weeks.  POSTOPERATIVE CONSTIPATION PROTOCOL Constipation - defined medically as fewer than three stools per week and severe constipation as less than one stool per week.  One of the most common issues patients have following surgery is constipation.  Even if you have a regular bowel pattern at home, your normal regimen is likely to be disrupted due to multiple reasons following surgery.  Combination of anesthesia, postoperative narcotics, change in appetite and fluid intake all can affect your bowels.  In order to avoid complications following surgery, here are some recommendations in order to help you during your recovery period.  Colace (docusate) - Pick up an over-the-counter form of  Colace or another stool softener and take twice a day as long as you are requiring postoperative pain medications.  Take with a full glass of water daily.  If you experience loose stools or diarrhea, hold the colace until you stool forms back up.  If your symptoms do not get better within 1 week or if they get worse, check with your doctor.  Dulcolax (bisacodyl) - Pick up over-the-counter and take as directed by the product  packaging as needed to assist with the movement of your bowels.  Take with a full glass of water.  Use this product as needed if not relieved by Colace only.   MiraLax (polyethylene glycol) - Pick up over-the-counter to have on hand.  MiraLax is a solution that will increase the amount of water in your bowels to assist with bowel movements.  Take as directed and can mix with a glass of water, juice, soda, coffee, or tea.  Take if you go more than two days without a movement. Do not use MiraLax more than once per day. Call your doctor if you are still constipated or irregular after using this medication for 7 days in a row.  If you continue to have problems with postoperative constipation, please contact the office for further assistance and recommendations.  If you experience "the worst abdominal pain ever" or develop nausea or vomiting, please contact the office immediatly for further recommendations for treatment.  ITCHING  If you experience itching with your medications, try taking only a single pain pill, or even half a pain pill at a time.  You can also use Benadryl over the counter for itching or also to help with sleep.   TED HOSE STOCKINGS Wear the elastic stockings on both legs for three weeks following surgery during the day but you may remove then at night for sleeping.  MEDICATIONS See your medication summary on the After Visit Summary that the nursing staff will review with you prior to discharge.  You may have some home medications which will be placed on hold until you complete the course of blood thinner medication.  It is important for you to complete the blood thinner medication as prescribed by your surgeon.  Continue your approved medications as instructed at time of discharge.  PRECAUTIONS If you experience chest pain or shortness of breath - call 911 immediately for transfer to the hospital emergency department.  If you develop a fever greater that 101 F, purulent drainage  from wound, increased redness or drainage from wound, foul odor from the wound/dressing, or calf pain - CONTACT YOUR SURGEON.                                                   FOLLOW-UP APPOINTMENTS Make sure you keep all of your appointments after your operation with your surgeon and caregivers. You should call the office at the above phone number and make an appointment for approximately two weeks after the date of your surgery or on the date instructed by your surgeon outlined in the "After Visit Summary".  RANGE OF MOTION AND STRENGTHENING EXERCISES  These exercises are designed to help you keep full movement of your hip joint. Follow your caregiver's or physical therapist's instructions. Perform all exercises about fifteen times, three times per day or as directed. Exercise both hips, even if you have had  only one joint replacement. These exercises can be done on a training (exercise) mat, on the floor, on a table or on a bed. Use whatever works the best and is most comfortable for you. Use music or television while you are exercising so that the exercises are a pleasant break in your day. This will make your life better with the exercises acting as a break in routine you can look forward to.  °• Lying on your back, slowly slide your foot toward your buttocks, raising your knee up off the floor. Then slowly slide your foot back down until your leg is straight again.  °• Lying on your back spread your legs as far apart as you can without causing discomfort.  °• Lying on your side, raise your upper leg and foot straight up from the floor as far as is comfortable. Slowly lower the leg and repeat.  °• Lying on your back, tighten up the muscle in the front of your thigh (quadriceps muscles). You can do this by keeping your leg straight and trying to raise your heel off the floor. This helps strengthen the largest muscle supporting your knee.  °• Lying on your back, tighten up the muscles of your buttocks both  with the legs straight and with the knee bent at a comfortable angle while keeping your heel on the floor.  ° °IF YOU ARE TRANSFERRED TO A SKILLED REHAB FACILITY °If the patient is transferred to a skilled rehab facility following release from the hospital, a list of the current medications will be sent to the facility for the patient to continue.  When discharged from the skilled rehab facility, please have the facility set up the patient's Home Health Physical Therapy prior to being released. Also, the skilled facility will be responsible for providing the patient with their medications at time of release from the facility to include their pain medication, the muscle relaxants, and their blood thinner medication. If the patient is still at the rehab facility at time of the two week follow up appointment, the skilled rehab facility will also need to assist the patient in arranging follow up appointment in our office and any transportation needs. ° °MAKE SURE YOU:  °• Understand these instructions.  °• Get help right away if you are not doing well or get worse.  ° ° °Pick up stool softner and laxative for home use following surgery while on pain medications. °Do not submerge incision under water. °Please use good hand washing techniques while changing dressing each day. °May shower starting three days after surgery. °Please use a clean towel to pat the incision dry following showers. °Continue to use ice for pain and swelling after surgery. °Do not use any lotions or creams on the incision until instructed by your surgeon. ° °

## 2017-12-06 NOTE — Interval H&P Note (Signed)
History and Physical Interval Note:  12/06/2017 11:09 AM  Mindy Snyder  has presented today for surgery, with the diagnosis of failed right total hip arthroplasty  The various methods of treatment have been discussed with the patient and family. After consideration of risks, benefits and other options for treatment, the patient has consented to  Procedure(s): Right hip polyethylene vs total hip arthroplasty revision (Right) as a surgical intervention .  The patient's history has been reviewed, patient examined, no change in status, stable for surgery.  I have reviewed the patient's chart and labs.  Questions were answered to the patient's satisfaction.     Homero Fellers Marylee Belzer

## 2017-12-06 NOTE — Transfer of Care (Signed)
Immediate Anesthesia Transfer of Care Note  Patient: Mindy Snyder  Procedure(s) Performed: Right hip polyethylene (Right Hip)  Patient Location: PACU  Anesthesia Type:MAC and Spinal  Level of Consciousness: awake, alert , oriented and patient cooperative  Airway & Oxygen Therapy: Patient Spontanous Breathing and Patient connected to face mask oxygen  Post-op Assessment: Report given to RN and Post -op Vital signs reviewed and stable  Post vital signs: Reviewed and stable  Last Vitals:  Vitals Value Taken Time  BP 116/68 12/06/2017  2:19 PM  Temp    Pulse 52 12/06/2017  2:21 PM  Resp 16 12/06/2017  2:21 PM  SpO2 100 % 12/06/2017  2:21 PM  Vitals shown include unvalidated device data.  Last Pain:  Vitals:   12/06/17 1049  TempSrc: Oral  PainSc: 0-No pain         Complications: No apparent anesthesia complications

## 2017-12-06 NOTE — Anesthesia Postprocedure Evaluation (Signed)
Anesthesia Post Note  Patient: Mindy Snyder  Procedure(s) Performed: Right hip polyethylene (Right Hip)     Patient location during evaluation: PACU Anesthesia Type: Spinal Level of consciousness: oriented, awake and alert and awake Pain management: pain level controlled Vital Signs Assessment: post-procedure vital signs reviewed and stable Respiratory status: spontaneous breathing, respiratory function stable and nonlabored ventilation Cardiovascular status: blood pressure returned to baseline and stable Postop Assessment: no headache, no backache, no apparent nausea or vomiting, patient able to bend at knees and spinal receding Anesthetic complications: no    Last Vitals:  Vitals:   12/06/17 1545 12/06/17 1600  BP: 138/72 125/74  Pulse: (!) 48 (!) 59  Resp: 13 14  Temp:    SpO2: 100% 100%    Last Pain:  Vitals:   12/06/17 1543  TempSrc:   PainSc: 0-No pain                 Cecile Hearing

## 2017-12-06 NOTE — Anesthesia Procedure Notes (Addendum)
Spinal  Patient location during procedure: OR Start time: 12/06/2017 12:29 PM End time: 12/06/2017 12:32 PM Staffing Anesthesiologist: Cecile Hearing, MD Performed: anesthesiologist  Preanesthetic Checklist Completed: patient identified, surgical consent, pre-op evaluation, timeout performed, IV checked, risks and benefits discussed and monitors and equipment checked Spinal Block Patient position: sitting Prep: site prepped and draped and DuraPrep Patient monitoring: continuous pulse ox and blood pressure Approach: midline Location: L4-5 Injection technique: single-shot Needle Needle type: Pencan  Needle gauge: 24 G Assessment Sensory level: T8 Additional Notes Functioning IV was confirmed and monitors were applied. Sterile prep and drape, including hand hygiene, mask and sterile gloves were used. The patient was positioned and the spine was prepped. The skin was anesthetized with lidocaine.  Free flow of clear CSF was obtained prior to injecting local anesthetic into the CSF.  The spinal needle aspirated freely following injection.  The needle was carefully withdrawn.  The patient tolerated the procedure well. Consent was obtained prior to procedure with all questions answered and concerns addressed. Risks including but not limited to bleeding, infection, nerve damage, paralysis, failed block, inadequate analgesia, allergic reaction, high spinal, itching and headache were discussed and the patient wished to proceed.   Arrie Aran, MD

## 2017-12-07 ENCOUNTER — Encounter (HOSPITAL_COMMUNITY): Payer: Self-pay | Admitting: Orthopedic Surgery

## 2017-12-07 LAB — BASIC METABOLIC PANEL
Anion gap: 8 (ref 5–15)
BUN: 12 mg/dL (ref 8–23)
CO2: 27 mmol/L (ref 22–32)
CREATININE: 0.56 mg/dL (ref 0.44–1.00)
Calcium: 9 mg/dL (ref 8.9–10.3)
Chloride: 106 mmol/L (ref 98–111)
GFR calc non Af Amer: >60 mL/min (ref >60)
Glucose, Bld: 162 mg/dL — ABNORMAL HIGH (ref 70–99)
Potassium: 4.2 mmol/L (ref 3.5–5.1)
SODIUM: 141 mmol/L (ref 135–145)

## 2017-12-07 LAB — CBC
HCT: 40 % (ref 36.0–46.0)
HEMOGLOBIN: 12.5 g/dL (ref 12.0–15.0)
MCH: 31 pg (ref 26.0–34.0)
MCHC: 31.3 g/dL (ref 30.0–36.0)
MCV: 99.3 fL (ref 80.0–100.0)
PLATELETS: 206 10*3/uL (ref 150–400)
RBC: 4.03 MIL/uL (ref 3.87–5.11)
RDW: 13.3 % (ref 11.5–15.5)
WBC: 7.2 10*3/uL (ref 4.0–10.5)
nRBC: 0 % (ref 0.0–0.2)

## 2017-12-07 MED ORDER — HYDROCODONE-ACETAMINOPHEN 5-325 MG PO TABS: 1.0000 | ORAL_TABLET | Freq: Four times a day (QID) | ORAL | 0 refills | Status: DC | PRN

## 2017-12-07 MED ORDER — ASPIRIN 325 MG PO TBEC: 325.0000 mg | DELAYED_RELEASE_TABLET | Freq: Two times a day (BID) | ORAL | 0 refills | Status: AC

## 2017-12-07 MED ORDER — TRAMADOL HCL 50 MG PO TABS: 50.0000 mg | ORAL_TABLET | Freq: Four times a day (QID) | ORAL | 0 refills | Status: AC | PRN

## 2017-12-07 MED ORDER — METHOCARBAMOL 500 MG PO TABS: 500.0000 mg | ORAL_TABLET | Freq: Four times a day (QID) | ORAL | 0 refills | Status: AC | PRN

## 2017-12-07 NOTE — Evaluation (Signed)
Physical Therapy Evaluation Patient Details Name: Mindy Snyder MRN: [id number] DOB: [birth year] Today's Date: 12/07/2017   History of Present Illness  Pt s/p R THR femoral head and poly liner revision.  Pt with hx of Bil THR  Clinical Impression  Pt s/p R THR revision and presents with decreased R LE strength/ROM, post op pain and posterior THP limiting functional mobility.  Pt should progress to dc home with family assist.    Follow Up Recommendations Follow surgeon's recommendation for DC plan and follow-up therapies;Home health PT    Equipment Recommendations  None recommended by PT    Recommendations for Other Services       Precautions / Restrictions Precautions Precautions: Fall;Posterior Hip Precaution Booklet Issued: Yes (comment) Precaution Comments: THP reviewed x 2 Restrictions Weight Bearing Restrictions: No RLE Weight Bearing: Weight bearing as tolerated      Mobility  Bed Mobility Overal bed mobility: Needs Assistance Bed Mobility: Supine to Sit     Supine to sit: Min assist     General bed mobility comments: cues for sequence, use of L LE to self assist and adherence to THP  Transfers Overall transfer level: Needs assistance   Transfers: Sit to/from Stand Sit to Stand: Min assist         General transfer comment: cues for LE management, use of UEs to self assist and adherence to THP  Ambulation/Gait Ambulation/Gait assistance: Min assist;Min guard Gait Distance (Feet): 120 Feet Assistive device: Rolling walker (2 wheeled) Gait Pattern/deviations: Step-to pattern;Decreased step length - right;Decreased step length - left;Shuffle;Trunk flexed Gait velocity: decr   General Gait Details: cues for sequence, posture, position from RW and ER on R  Stairs Stairs: Yes Stairs assistance: Min assist Stair Management: No rails;Step to pattern;Forwards;With walker Number of Stairs: 2 General stair comments: single step twice with cues for  sequence and foot/RW placement  Wheelchair Mobility    Modified Rankin (Stroke Patients Only)       Balance Overall balance assessment: Mild deficits observed, not formally tested                                           Pertinent Vitals/Pain Pain Assessment: 0-10 Pain Score: 3  Pain Location: R hip Pain Descriptors / Indicators: Aching;Sore Pain Intervention(s): Limited activity within patient's tolerance;Monitored during session;Premedicated before session;Ice applied    Home Living Family/patient expects to be discharged to:: Private residence Living Arrangements: Alone Available Help at Discharge: Family Type of Home: Other(Comment) Home Access: Stairs to enter Entrance Stairs-Rails: None Entrance Stairs-Number of Steps: 1 Home Layout: One level Home Equipment: Environmental consultant - 2 wheels;Bedside commode      Prior Function Level of Independence: Independent               Hand Dominance        Extremity/Trunk Assessment   Upper Extremity Assessment Upper Extremity Assessment: Overall WFL for tasks assessed    Lower Extremity Assessment Lower Extremity Assessment: RLE deficits/detail RLE Deficits / Details: 3-/5 strength at hip with AAROM at hip to 85 flex and 20 abd    Cervical / Trunk Assessment Cervical / Trunk Assessment: Normal  Communication   Communication: No difficulties  Cognition Arousal/Alertness: Awake/alert Behavior During Therapy: WFL for tasks assessed/performed;Impulsive Overall Cognitive Status: Within Functional Limits for tasks assessed  General Comments      Exercises Total Joint Exercises Ankle Circles/Pumps: AROM;Both;20 reps;Supine Quad Sets: AROM;Both;10 reps;Supine Heel Slides: AAROM;Right;20 reps;Supine Hip ABduction/ADduction: AAROM;Right;15 reps;Supine   Assessment/Plan    PT Assessment Patient needs continued PT services  PT Problem List  Decreased strength;Decreased range of motion;Decreased activity tolerance;Decreased mobility;Decreased knowledge of use of DME;Pain;Decreased knowledge of precautions       PT Treatment Interventions DME instruction;Gait training;Stair training;Functional mobility training;Therapeutic activities;Therapeutic exercise;Patient/family education    PT Goals (Current goals can be found in the Care Plan section)  Acute Rehab PT Goals Patient Stated Goal: Regain IND PT Goal Formulation: With patient Time For Goal Achievement: 12/14/17 Potential to Achieve Goals: Good    Frequency 7X/week   Barriers to discharge        Co-evaluation               AM-PAC PT "6 Clicks" Daily Activity  Outcome Measure Difficulty turning over in bed (including adjusting bedclothes, sheets and blankets)?: Unable Difficulty moving from lying on back to sitting on the side of the bed? : Unable Difficulty sitting down on and standing up from a chair with arms (e.g., wheelchair, bedside commode, etc,.)?: Unable Help needed moving to and from a bed to chair (including a wheelchair)?: A Little Help needed walking in hospital room?: A Little Help needed climbing 3-5 steps with a railing? : A Little 6 Click Score: 12    End of Session Equipment Utilized During Treatment: Gait belt Activity Tolerance: Patient tolerated treatment well Patient left: in chair;with call bell/phone within reach;with chair alarm set Nurse Communication: Mobility status PT Visit Diagnosis: Difficulty in walking, not elsewhere classified (R26.2)    Time: 7829-5621 PT Time Calculation (min) (ACUTE ONLY): 45 min   Charges:   PT Evaluation $PT Eval Low Complexity: 1 Low PT Treatments $Gait Training: 8-22 mins $Therapeutic Exercise: 8-22 mins        Mauro Kaufmann PT Acute Rehabilitation Services Pager [phone] Office [phone]   Soha Thorup 12/07/2017, 1:26 PM

## 2017-12-07 NOTE — Progress Notes (Signed)
Physical Therapy Treatment Patient Details Name: Mindy Snyder: [id number] DOB: 01-Apr-1950 Today's Date: 12/07/2017    History of Present Illness Pt s/p R THR femoral head and poly liner revision.  Pt with hx of Bil THR    PT Comments    Pt progressing steadily with mobility and eager for dc home.  Increased activity tolerance but continues to require occasional cueing for adherence to THP.   Follow Up Recommendations  Follow surgeon's recommendation for DC plan and follow-up therapies;Home health PT     Equipment Recommendations  None recommended by PT    Recommendations for Other Services       Precautions / Restrictions Precautions Precautions: Fall;Posterior Hip Precaution Booklet Issued: Yes (comment) Precaution Comments: THP reviewed x 2 Restrictions Weight Bearing Restrictions: No RLE Weight Bearing: Weight bearing as tolerated    Mobility  Bed Mobility Overal bed mobility: Needs Assistance Bed Mobility: Supine to Sit;Sit to Supine     Supine to sit: Min guard;Supervision Sit to supine: Min guard;Supervision   General bed mobility comments: cues for sequence, use of L LE to self assist and adherence to THP  Transfers Overall transfer level: Needs assistance   Transfers: Sit to/from Stand Sit to Stand: Min guard;Supervision         General transfer comment: cues for LE management, use of UEs to self assist and adherence to THP  Ambulation/Gait Ambulation/Gait assistance: Min guard;Supervision Gait Distance (Feet): 160 Feet(and 20' twice to/from bathroom) Assistive device: Rolling walker (2 wheeled) Gait Pattern/deviations: Step-to pattern;Decreased step length - right;Decreased step length - left;Shuffle;Trunk flexed Gait velocity: decr   General Gait Details: cues for sequence, posture, position from RW and ER on R   Stairs Stairs: Yes Stairs assistance: Min guard Stair Management: No rails;Step to pattern;Forwards;With walker Number  of Stairs: 2 General stair comments: single step twice with cues for sequence and foot/RW placement   Wheelchair Mobility    Modified Rankin (Stroke Patients Only)       Balance Overall balance assessment: Mild deficits observed, not formally tested                                          Cognition Arousal/Alertness: Awake/alert Behavior During Therapy: WFL for tasks assessed/performed;Impulsive Overall Cognitive Status: Within Functional Limits for tasks assessed                                        Exercises Total Joint Exercises Ankle Circles/Pumps: AROM;Both;20 reps;Supine Quad Sets: AROM;Both;10 reps;Supine Heel Slides: AAROM;Right;20 reps;Supine Hip ABduction/ADduction: AAROM;Right;15 reps;Supine    General Comments        Pertinent Vitals/Pain Pain Assessment: 0-10 Pain Score: 3  Pain Location: R hip Pain Descriptors / Indicators: Aching;Sore Pain Intervention(s): Limited activity within patient's tolerance;Monitored during session;Premedicated before session;Ice applied    Home Living Family/patient expects to be discharged to:: Private residence Living Arrangements: Alone Available Help at Discharge: Family Type of Home: Other(Comment) Home Access: Stairs to enter Entrance Stairs-Rails: None Home Layout: One level Home Equipment: Environmental consultant - 2 wheels;Bedside commode      Prior Function Level of Independence: Independent          PT Goals (current goals can now be found in the care plan section) Acute Rehab PT Goals Patient Stated Goal:  Regain IND PT Goal Formulation: With patient Time For Goal Achievement: 12/14/17 Potential to Achieve Goals: Good Progress towards PT goals: Progressing toward goals    Frequency    7X/week      PT Plan Current plan remains appropriate    Co-evaluation              AM-PAC PT "6 Clicks" Daily Activity  Outcome Measure  Difficulty turning over in bed (including  adjusting bedclothes, sheets and blankets)?: A Lot Difficulty moving from lying on back to sitting on the side of the bed? : A Lot Difficulty sitting down on and standing up from a chair with arms (e.g., wheelchair, bedside commode, etc,.)?: A Lot Help needed moving to and from a bed to chair (including a wheelchair)?: A Little Help needed walking in hospital room?: A Little Help needed climbing 3-5 steps with a railing? : A Little 6 Click Score: 15    End of Session Equipment Utilized During Treatment: Gait belt Activity Tolerance: Patient tolerated treatment well Patient left: in chair;with call bell/phone within reach;with chair alarm set Nurse Communication: Mobility status PT Visit Diagnosis: Difficulty in walking, not elsewhere classified (R26.2)     Time: 1610-9604 PT Time Calculation (min) (ACUTE ONLY): 36 min  Charges:  $Gait Training: 8-22 mins $Therapeutic Exercise: 8-22 mins $Therapeutic Activity: 8-22 mins                     Mauro Kaufmann PT Acute Rehabilitation Services Pager [phone] Office [phone]    [name] 12/07/2017, 1:34 PM

## 2017-12-07 NOTE — Care Management Note (Signed)
Case Management Note  Patient Details  Name: Mindy Snyder MRN: [id number] Date of Birth: 1950/07/01  Subjective/Objective:   Discharge planning, spoke with patient and spouse at beside. Chose [facility] for [facility] services, PT to eval and treat.                  Action/Plan: Contacted [facility] for referral. Has RW and 3-n-1. [phone]   Expected Discharge Date:  12/07/17               Expected Discharge Plan:  Home w Home Health Services  In-House Referral:  NA  Discharge planning Services  CM Consult  Post Acute Care Choice:  Home Health Choice offered to:  Patient  DME Arranged:  N/A DME Agency:  NA  HH Arranged:  PT HH Agency:  [facility]  Status of Service:  Completed, signed off  If discussed at Long Length of Stay Meetings, dates discussed:    Additional Comments:  Alexis Goodell, RN 12/07/2017, 11:43 AM

## 2017-12-07 NOTE — Progress Notes (Addendum)
   Subjective: 1 Day Post-Op Procedure(s) (LRB): Right hip polyethylene (Right) Patient reports pain as mild.   Patient seen in rounds by Dr. Lequita Halt. Patient is well, and has had no acute complaints or problems other than mild hip pain. Foley catheter removed today. No problems overnight. Denies SHOB, CP.  We will start therapy today.   Objective: Vital signs in last 24 hours: Temp:  [97.4 F (36.3 C)-97.8 F (36.6 C)] 97.4 F (36.3 C) (10/10 0455) Pulse Rate:  [45-73] 49 (10/10 0455) Resp:  [9-22] 16 (10/10 0455) BP: (98-143)/(56-91) 133/80 (10/10 0455) SpO2:  [95 %-100 %] 100 % (10/10 0455) Weight:  [61.2 kg] 61.2 kg (10/09 1049)  Intake/Output from previous day:  Intake/Output Summary (Last 24 hours) at 12/07/2017 0819 Last data filed at 12/07/2017 0800 Gross per 24 hour  Intake 4224.53 ml  Output 3550 ml  Net 674.53 ml     Intake/Output this shift: Total I/O In: -  Out: 700 [Urine:700]  Labs: Recent Labs    12/07/17 0508  HGB 12.5   Recent Labs    12/07/17 0508  WBC 7.2  RBC 4.03  HCT 40.0  PLT 206   Recent Labs    12/07/17 0508  NA 141  K 4.2  CL 106  CO2 27  BUN 12  CREATININE 0.56  GLUCOSE 162*  CALCIUM 9.0   Exam: General - Patient is Alert and Oriented Extremity - Neurologically intact Neurovascular intact Sensation intact distally Dorsiflexion/Plantar flexion intact Dressing - dressing C/D/I Motor Function - intact, moving foot and toes well on exam.   Past Medical History:  Diagnosis Date  . Arthritis    pain and oa left hip-plans hip replacement  -- s/p right hip replacement -doing well.  pt has arthritis pain lower back, ankles, wrists and "everywhere"  . Essential hypertension   . History of diverticulitis of colon 01/2016   with perforation of sigmoid colon , no bleeding,  resolved without surgical intervention  . History of syncope 07/2016   ED visit---  and cardiologist-- dr Eldridge Dace note dated 11-24-2016 felt to be  dehydration and HCTZ  . Hyperlipidemia   . Vertigo, benign paroxysmal   . Wears glasses     Assessment/Plan: 1 Day Post-Op Procedure(s) (LRB): Right hip polyethylene (Right) Principal Problem:   Failed total hip arthroplasty (HCC)  Estimated body mass index is 23.35 kg/m as calculated from the following:   Height as of this encounter: 5' 3.75" (1.619 m).   Weight as of this encounter: 61.2 kg. Advance diet Up with therapy D/C IV fluids  DVT Prophylaxis - Aspirin Weight bearing as tolerated. D/C O2 and pulse ox and try on room air. Hemovac pulled without difficulty, will begin therapy. Hip precautions discussed with patient.   Plan is to go Home after hospital stay. Plan for discharge with home health PT after one session of therapy if meeting goals. Follow up in the office with Dr. Lequita Halt in two weeks.   Arther Abbott, PA-C Orthopedic Surgery 12/07/2017, 8:19 AM

## 2017-12-07 NOTE — Plan of Care (Signed)
  Problem: Education: Goal: Knowledge of General Education information will improve Description Including pain rating scale, medication(s)/side effects and non-pharmacologic comfort measures Outcome: Progressing   Problem: Clinical Measurements: Goal: Will remain free from infection Outcome: Progressing Goal: Respiratory complications will improve Outcome: Progressing Goal: Cardiovascular complication will be avoided Outcome: Progressing   Problem: Nutrition: Goal: Adequate nutrition will be maintained Outcome: Progressing   Problem: Pain Managment: Goal: General experience of comfort will improve Outcome: Progressing   Problem: Safety: Goal: Ability to remain free from injury will improve Outcome: Progressing   Problem: Education: Goal: Knowledge of the prescribed therapeutic regimen will improve Outcome: Progressing   Problem: Pain Management: Goal: Pain level will decrease with appropriate interventions Outcome: Progressing   Reeves Forth, RN 12/07/17 1:12 AM

## 2017-12-07 NOTE — Progress Notes (Signed)
Pt was provided with d/c instructions and prescriptions. After discussing the pt's plan of care upon d/c home, the pt reported no further questions or concerns.  

## 2017-12-11 NOTE — Discharge Summary (Signed)
Physician Discharge Summary   Patient ID: Mindy Snyder MRN: [id number] DOB/AGE: 11/20/1950 67 y.o.  Admit date: 12/06/2017 Discharge date: 12/07/2017  Primary Diagnosis: Failed right total hip arthroplasty   Admission Diagnoses:  Past Medical History:  Diagnosis Date  . Arthritis    pain and oa left hip-plans hip replacement  -- s/p right hip replacement -doing well.  pt has arthritis pain lower back, ankles, wrists and "everywhere"  . Essential hypertension   . History of diverticulitis of colon 01/2016   with perforation of sigmoid colon , no bleeding,  resolved without surgical intervention  . History of syncope 07/2016   ED visit---  and cardiologist-- dr Irish Lack note dated 11-24-2016 felt to be dehydration and HCTZ  . Hyperlipidemia   . Vertigo, benign paroxysmal   . Wears glasses    Discharge Diagnoses:   Principal Problem:   Failed total hip arthroplasty ([place])  Estimated body mass index is 23.35 kg/m as calculated from the following:   Height as of this encounter: 5' 3.75" (1.619 m).   Weight as of this encounter: 61.2 kg.  Procedure:  Procedure(s) (LRB): Right hip polyethylene (Right)   Consults: None  HPI: The patient a 67 year old female who had a right total hip arthroplasty done in 2003.  She recently has developed increasing pain in the hip.  She had x-ray showing a near complete wear through of her acetabular polyethylene.  She had a bone scan which showed no evidence of any component loosening.  She presents now for acetabular versus total hip arthroplasty revision.  Laboratory Data: Admission on 12/06/2017, Discharged on 12/07/2017  Component Date Value Ref Range Status  . WBC 12/07/2017 7.2  4.0 - 10.5 K/uL Final  . RBC 12/07/2017 4.03  3.87 - 5.11 MIL/uL Final  . Hemoglobin 12/07/2017 12.5  12.0 - 15.0 g/dL Final  . HCT 12/07/2017 40.0  36.0 - 46.0 % Final  . MCV 12/07/2017 99.3  80.0 - 100.0 fL Final  . MCH 12/07/2017 31.0  26.0 - 34.0 pg  Final  . MCHC 12/07/2017 31.3  30.0 - 36.0 g/dL Final  . RDW 12/07/2017 13.3  11.5 - 15.5 % Final  . Platelets 12/07/2017 206  150 - 400 K/uL Final  . nRBC 12/07/2017 0.0  0.0 - 0.2 % Final   Performed at [facility], [place] [address], [place], [place] [address]  . Sodium 12/07/2017 141  135 - 145 mmol/L Final  . Potassium 12/07/2017 4.2  3.5 - 5.1 mmol/L Final  . Chloride 12/07/2017 106  98 - 111 mmol/L Final  . CO2 12/07/2017 27  22 - 32 mmol/L Final  . Glucose, Bld 12/07/2017 162* 70 - 99 mg/dL Final  . BUN 12/07/2017 12  8 - 23 mg/dL Final  . Creatinine, Ser 12/07/2017 0.56  0.44 - 1.00 mg/dL Final  . Calcium 12/07/2017 9.0  8.9 - 10.3 mg/dL Final  . GFR calc non Af Amer 12/07/2017 >60  >60 mL/min Final  . GFR calc Af Amer 12/07/2017 >60  >60 mL/min Final   Comment: (NOTE) The eGFR has been calculated using the CKD EPI equation. This calculation has not been validated in all clinical situations. eGFR's persistently <60 mL/min signify possible Chronic Kidney Disease.   Georgiann Hahn gap 12/07/2017 8  5 - 15 Final   Performed at [facility], [place] [address], [place], [place] [address]  Hospital Outpatient Visit on 11/29/2017  Component Date Value Ref Range Status  .  aPTT 11/29/2017 28  24 - 36 seconds Final   Performed at [facility], [place] [address], [place], [place] [address]  . WBC 11/29/2017 6.0  4.0 - 10.5 K/uL Final  . RBC 11/29/2017 4.44  3.87 - 5.11 MIL/uL Final  . Hemoglobin 11/29/2017 14.0  12.0 - 15.0 g/dL Final  . HCT 11/29/2017 43.1  36.0 - 46.0 % Final  . MCV 11/29/2017 97.1  78.0 - 100.0 fL Final  . MCH 11/29/2017 31.5  26.0 - 34.0 pg Final  . MCHC 11/29/2017 32.5  30.0 - 36.0 g/dL Final  . RDW 11/29/2017 14.0  11.5 - 15.5 % Final  . Platelets 11/29/2017 226  150 - 400 K/uL Final   Performed at [facility], [place] [address], [place], [place] [address]  . Sodium 11/29/2017 142  135 - 145 mmol/L  Final  . Potassium 11/29/2017 5.1  3.5 - 5.1 mmol/L Final  . Chloride 11/29/2017 106  98 - 111 mmol/L Final  . CO2 11/29/2017 29  22 - 32 mmol/L Final  . Glucose, Bld 11/29/2017 100* 70 - 99 mg/dL Final  . BUN 11/29/2017 11  8 - 23 mg/dL Final  . Creatinine, Ser 11/29/2017 0.59  0.44 - 1.00 mg/dL Final  . Calcium 11/29/2017 9.8  8.9 - 10.3 mg/dL Final  . Total Protein 11/29/2017 6.7  6.5 - 8.1 g/dL Final  . Albumin 11/29/2017 4.2  3.5 - 5.0 g/dL Final  . AST 11/29/2017 21  15 - 41 U/L Final  . ALT 11/29/2017 24  0 - 44 U/L Final  . Alkaline Phosphatase 11/29/2017 65  38 - 126 U/L Final  . Total Bilirubin 11/29/2017 0.9  0.3 - 1.2 mg/dL Final  . GFR calc non Af Amer 11/29/2017 >60  >60 mL/min Final  . GFR calc Af Amer 11/29/2017 >60  >60 mL/min Final   Comment: (NOTE) The eGFR has been calculated using the CKD EPI equation. This calculation has not been validated in all clinical situations. eGFR's persistently <60 mL/min signify possible Chronic Kidney Disease.   Georgiann Hahn gap 11/29/2017 7  5 - 15 Final   Performed at [facility], [place] [address] [place], [place] [address]  . Prothrombin Time 11/29/2017 12.5  11.4 - 15.2 seconds Final  . INR 11/29/2017 0.94   Final   Performed at [facility], [place] [address], [place], [place] [address]  . ABO/RH(D) 11/29/2017 A POS   Final  . Antibody Screen 11/29/2017 NEG   Final  . Sample Expiration 11/29/2017 12/09/2017   Final  . Extend sample reason 11/29/2017    Final                   Value:NO TRANSFUSIONS OR PREGNANCY IN THE PAST 3 MONTHS Performed at [facility], [place] [address], [place], [place] [address]   . MRSA, PCR 11/29/2017 NEGATIVE  NEGATIVE Final  . Staphylococcus aureus 11/29/2017 POSITIVE* NEGATIVE Final   Comment: (NOTE) The Xpert SA Assay (FDA approved for NASAL specimens in patients 12 years of age and older), is one component of a comprehensive surveillance program.  It is not intended to diagnose infection nor to guide or monitor treatment. Performed at [facility], [place] [address], [place], [place] [address]      X-Rays:Dg Pelvis Portable  Result Date: 12/06/2017 CLINICAL DATA:  Status post right total hip replacement. EXAM: PORTABLE PELVIS 1-2 VIEWS COMPARISON:  03/23/2011 FINDINGS: Bilateral total  hip arthroplasties are again noted. There has been interval right femoral head and polyethylene liner revision. A surgical drain is in place. No acute fracture or dislocation is evident on this single AP image. IMPRESSION: Postoperative changes without evidence of acute abnormality. Electronically Signed   By: Logan Bores M.D.   On: 12/06/2017 17:07    EKG: Orders placed or performed during the hospital encounter of 08/24/17  . ED EKG  . ED EKG  . EKG 12-Lead  . EKG 12-Lead     Hospital Course: Jae Bruck is a 67 y.o. who was admitted to [facility]. They were brought to the operating room on 12/06/2017 and underwent Procedure(s): Right hip polyethylene.  Patient tolerated the procedure well and was later transferred to the recovery room and then to the orthopaedic floor for postoperative care. They were given PO and IV analgesics for pain control following their surgery. They were given 24 hours of postoperative antibiotics of  Anti-infectives (From admission, onward)   Start     Dose/Rate Route Frequency Ordered Stop   12/06/17 1830  ceFAZolin (ANCEF) IVPB 1 g/50 mL premix     1 g 100 mL/hr over 30 Minutes Intravenous Every 6 hours 12/06/17 1727 12/07/17 0100   12/06/17 1100  ceFAZolin (ANCEF) IVPB 2g/100 mL premix     2 g 200 mL/hr over 30 Minutes Intravenous On call to O.R. 12/06/17 1045 12/06/17 1303     and started on DVT prophylaxis in the form of Aspirin.   PT and OT were ordered for total joint protocol. Discharge planning consulted to help with postop disposition and equipment needs.  Patient had a good  night on the evening of surgery. They started to get up OOB with therapy on POD #1. Pt was seen during rounds and was ready to go home pending progress with therapy. Hemovac drain was pulled without difficulty. She worked with therapy on POD #1 and was meeting her goals. Pt was discharged to home later that day in stable condition.  Diet: Regular diet Activity: WBAT. Hip precautions: no crossing legs, flexion > 90 degrees, or excessive rotation. Discussed with patient.  Follow-up: in 2 weeks with Dr. Wynelle Link Disposition: Home with HHPT Discharged Condition: stable   Discharge Instructions    Call MD / Call 911   Complete by:  As directed    If you experience chest pain or shortness of breath, CALL 911 and be transported to the hospital emergency room.  If you develope a fever above 101 F, pus (white drainage) or increased drainage or redness at the wound, or calf pain, call your surgeon's office.   Change dressing   Complete by:  As directed    You may change your dressing on Friday, then change the dressing daily with sterile 4 x 4 inch gauze dressing and paper tape.   Constipation Prevention   Complete by:  As directed    Drink plenty of fluids.  Prune juice may be helpful.  You may use a stool softener, such as Colace (over the counter) 100 mg twice a day.  Use MiraLax (over the counter) for constipation as needed.   Diet - low sodium heart healthy   Complete by:  As directed    Driving restrictions   Complete by:  As directed    No driving for 2 weeks   Follow the hip precautions as taught in Physical Therapy   Complete by:  As directed    TED hose  Complete by:  As directed    Use stockings (TED hose) for 3 weeks on both leg(s).  You may remove them at night for sleeping.   Weight bearing as tolerated   Complete by:  As directed      Allergies as of 12/07/2017      Reactions   Codeine Nausea Only, Other (See Comments)   Upset Stomach      Medication List    STOP taking  these medications   ibuprofen 200 MG tablet Commonly known as:  ADVIL,MOTRIN     TAKE these medications   acetaminophen 325 MG tablet Commonly known as:  TYLENOL Take 2 tablets (650 mg total) by mouth every 6 (six) hours as needed for mild pain (or Fever >/= 101).   aspirin 325 MG EC tablet Take 1 tablet (325 mg total) by mouth 2 (two) times daily for 20 days. Take one tablet (325 mg) Aspirin two times a day for three weeks following surgery. Then take one baby Aspirin (81 mg) once a day for three weeks. Then discontinue aspirin.   atorvastatin 40 MG tablet Commonly known as:  LIPITOR Take 40 mg by mouth every morning.   HYDROcodone-acetaminophen 5-325 MG tablet Commonly known as:  NORCO/VICODIN Take 1-2 tablets by mouth every 6 (six) hours as needed for severe pain (pain score 4-6).   lisinopril 20 MG tablet Commonly known as:  PRINIVIL,ZESTRIL Take 20 mg by mouth every morning.   meclizine 25 MG tablet Commonly known as:  ANTIVERT Take 1 tablet (25 mg total) by mouth 3 (three) times daily as needed for dizziness.   methocarbamol 500 MG tablet Commonly known as:  ROBAXIN Take 1 tablet (500 mg total) by mouth every 6 (six) hours as needed for muscle spasms.   traMADol 50 MG tablet Commonly known as:  ULTRAM Take 1-2 tablets (50-100 mg total) by mouth every 6 (six) hours as needed for moderate pain.            Discharge Care Instructions  (From admission, onward)         Start     Ordered   12/07/17 0000  Weight bearing as tolerated     12/07/17 0831   12/07/17 0000  Change dressing    Comments:  You may change your dressing on Friday, then change the dressing daily with sterile 4 x 4 inch gauze dressing and paper tape.   12/07/17 0831         Follow-up Information    Gaynelle Arabian, MD. Schedule an appointment as soon as possible for a visit on 12/21/2017.   Specialty:  Orthopedic Surgery Contact information: [address] [place]  [address] [phone]        [facility], [facility] Follow up.   Specialty:  [place] Why:  physical therapy Contact information: [place] [address] [phone]           Signed: Theresa Duty, PA-C Orthopedic Surgery 12/11/2017, 12:50 PM

## 2018-07-11 IMAGING — CT CT HEAD W/O CM
3 series · 15 of 47 positions shown, 18 images · non-contrast
Comparison: None.

CLINICAL DATA: Dizziness, nausea, vomiting.

EXAM:
CT HEAD WITHOUT CONTRAST
TECHNIQUE: Contiguous axial images were obtained from the base of the skull
through the vertex without intravenous contrast.

[Series 2: head wo · axial · 0.47mm/px · z∈[-156,-31]mm · 9 of 30 slices shown, 12 images]
[im 3/30  brain]
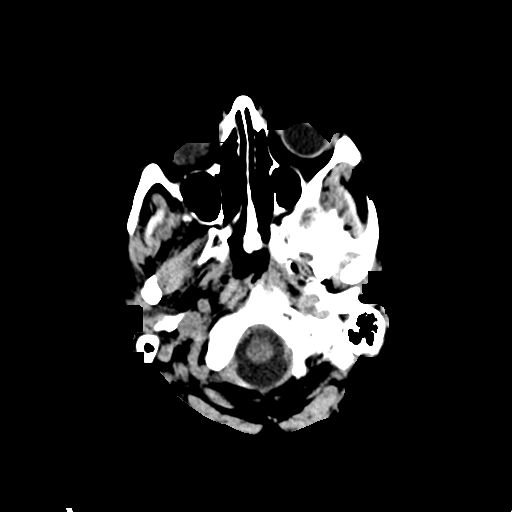
[im 3/30  bone]
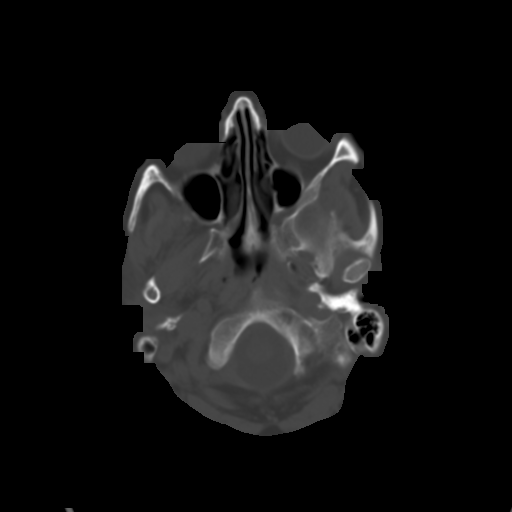
[im 6/30  brain]
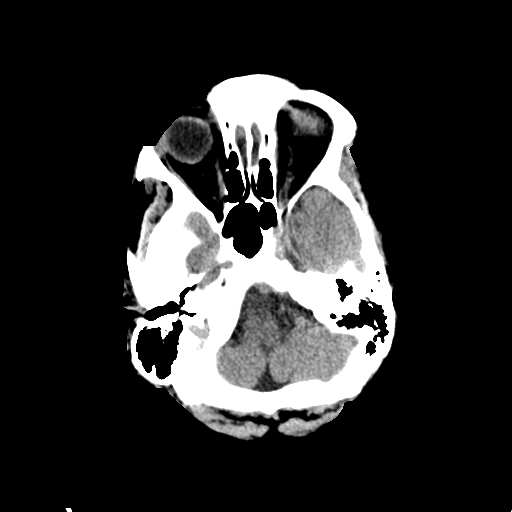
[im 9/30  brain]
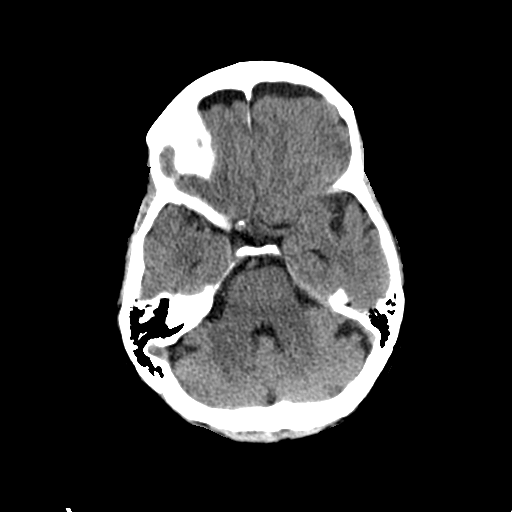
[im 12/30  brain]
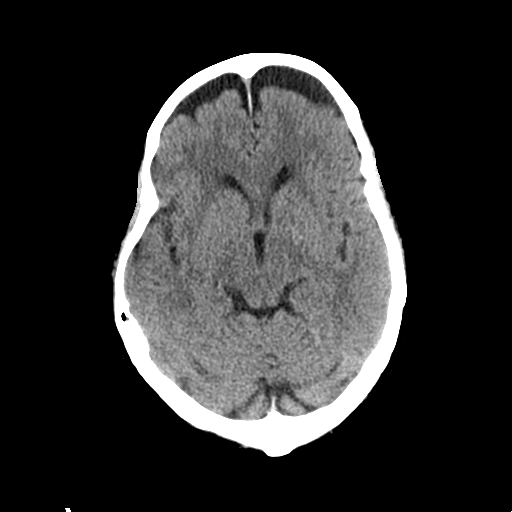
[im 16/30  brain]
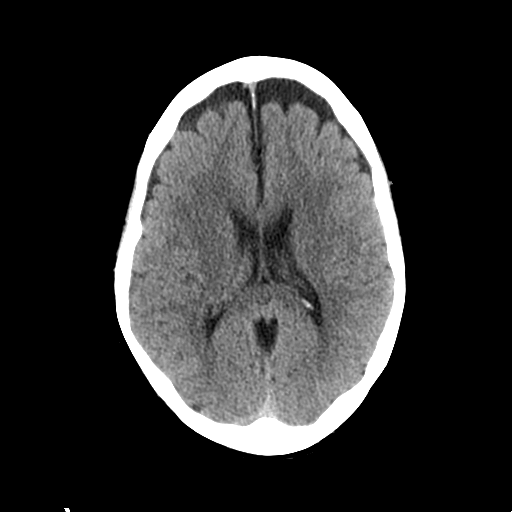
[im 16/30  bone]
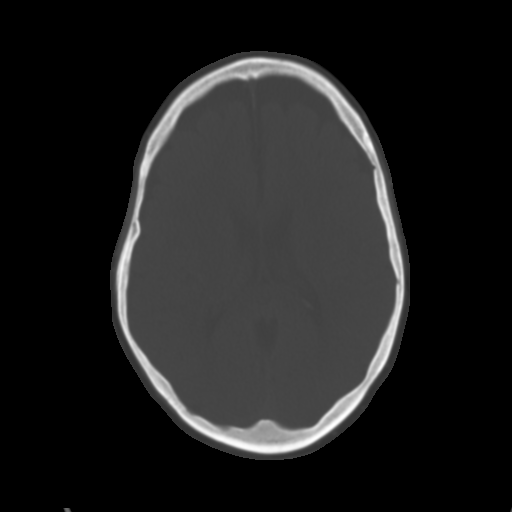
[im 19/30  brain]
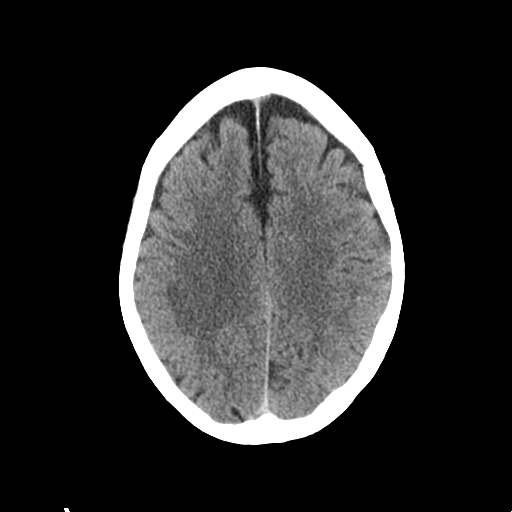
[im 22/30  brain]
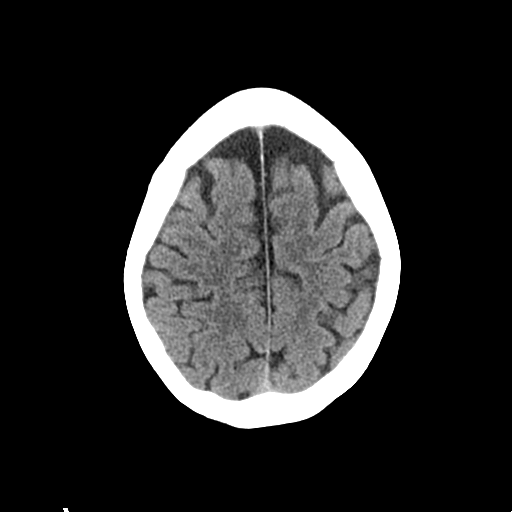
[im 25/30  brain]
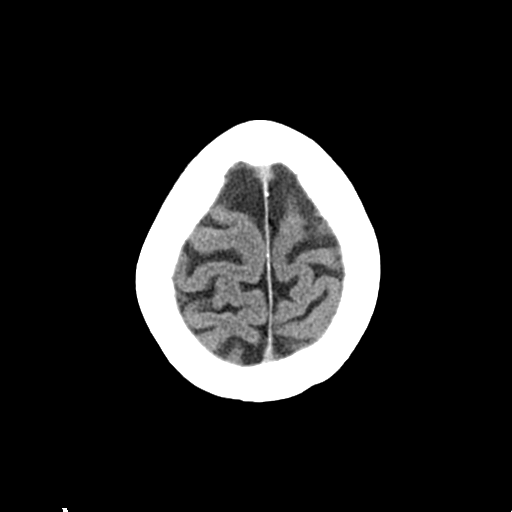
[im 28/30  brain]
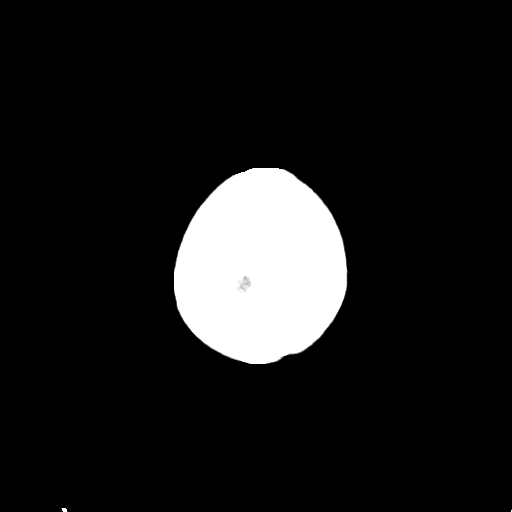
[im 28/30  bone]
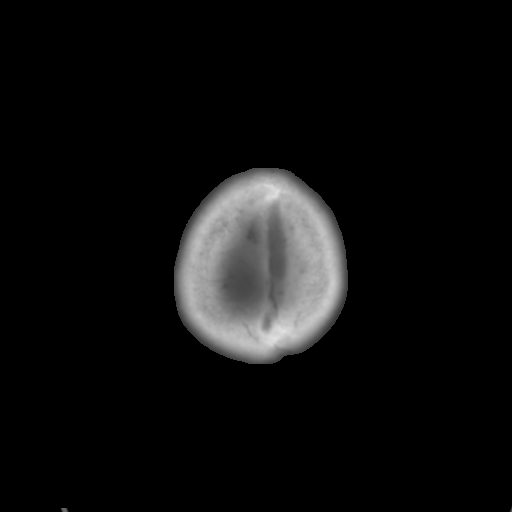

[Series 5: coronal soft tissue · coronal · 0.29mm/px · 3 of 79 slices shown]
[im 27/79  brain]
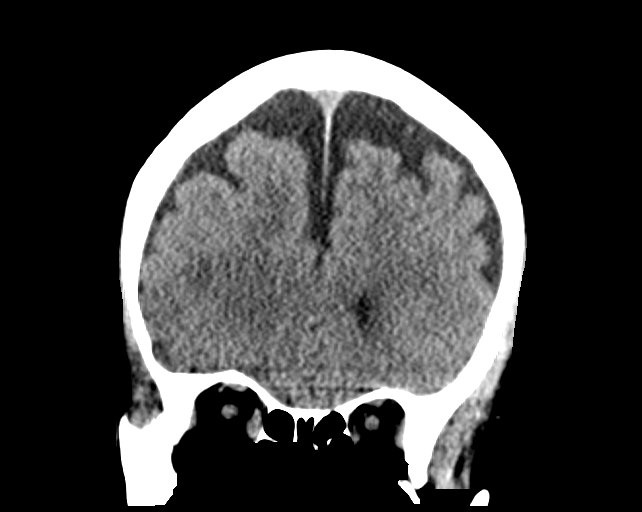
[im 35/79  brain]
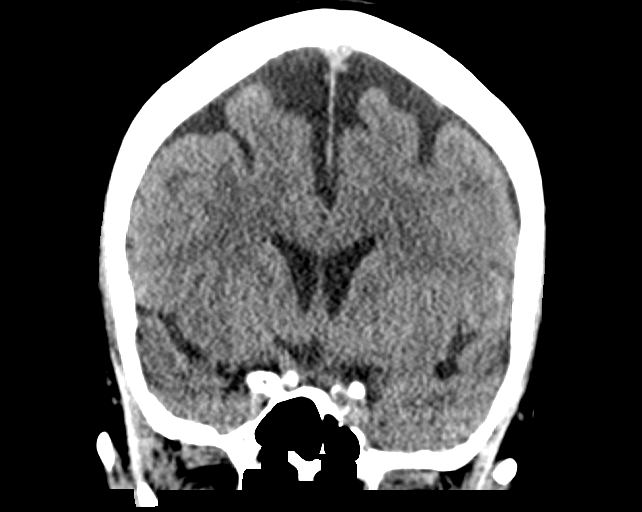
[im 44/79  brain]
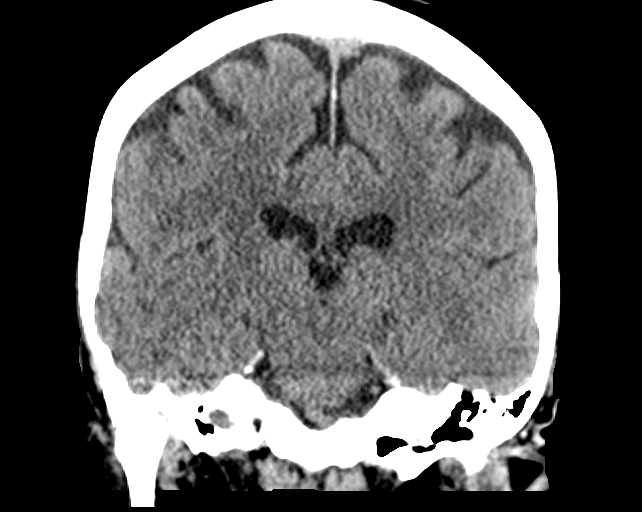

[Series 6: sagittal soft tissue · sagittal · 0.32mm/px · 3 of 58 slices shown]
[im 20/58  brain]
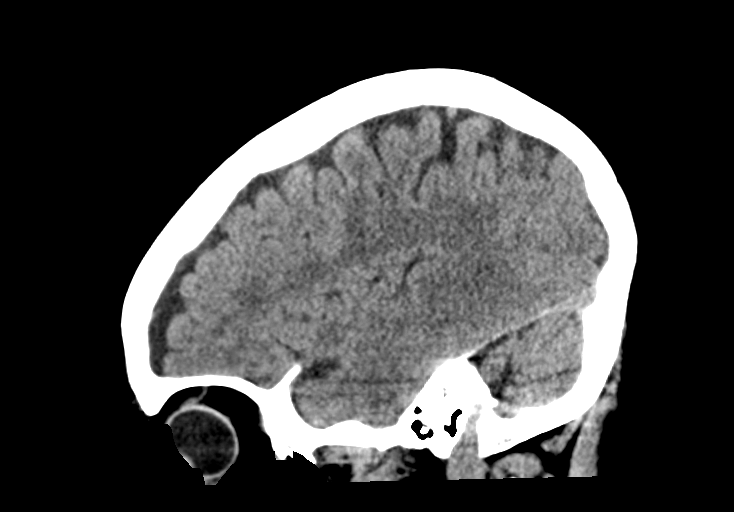
[im 29/58  brain]
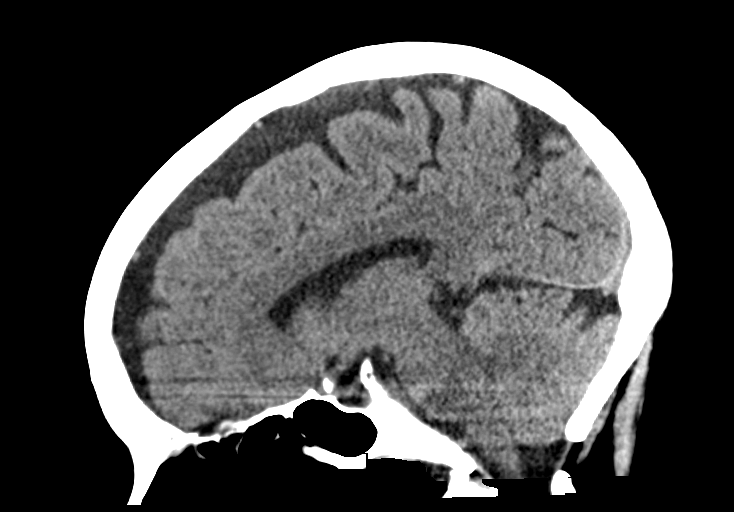
[im 39/58  brain]
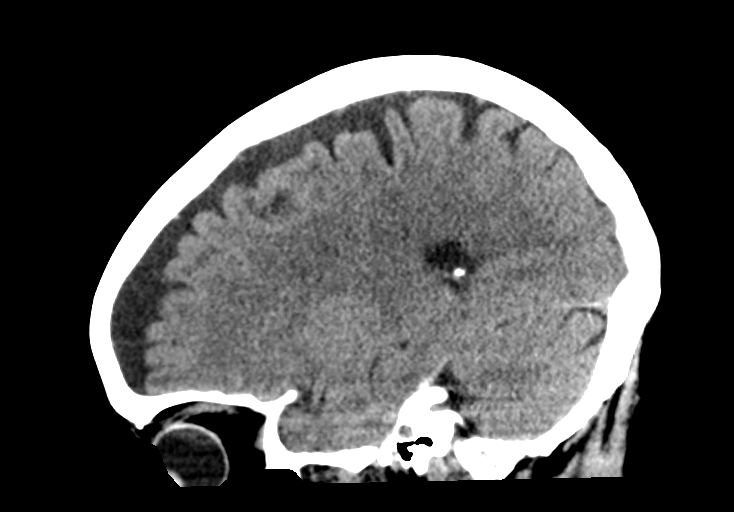

[15 of 47 positions shown; findings below may reference images not displayed]

FINDINGS: Brain: Mild diffuse cortical atrophy is noted. No mass effect or
midline shift is noted. Ventricular size is within normal limits.
There is no evidence of mass lesion, hemorrhage or acute infarction.

Vascular: No hyperdense vessel or unexpected calcification.

Skull: Normal. Negative for fracture or focal lesion.

Sinuses/Orbits: No acute finding.

Other: None.
IMPRESSION: Mild diffuse cortical atrophy. No acute intracranial abnormality
seen.

## 2018-07-11 IMAGING — MR MR HEAD W/O CM
8 of 10 series · 37 of 48 positions shown · non-contrast
Comparison: Head CT same day

CLINICAL DATA: Acute presentation with dizziness, nausea and
vomiting beginning 9799 hours.

EXAM:
MRI HEAD WITHOUT CONTRAST
TECHNIQUE: Multiplanar, multiecho pulse sequences of the brain and surrounding
structures were obtained without intravenous contrast.

[Series 3: DWI · axial · 3.0mm · 1.09mm/px · z∈[-96,+56]mm · 8 of 106 slices shown (1 of 4)]
[im 1/106]
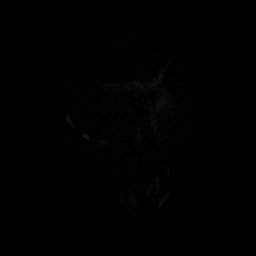
[im 12/106]
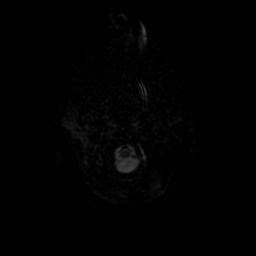
[im 36/106]
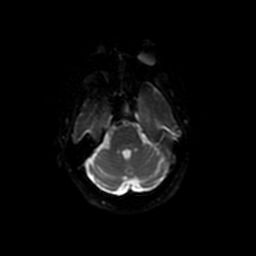
[im 47/106]
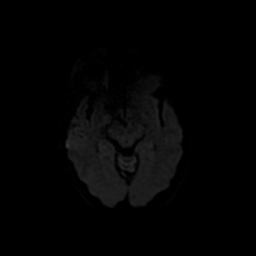
[im 59/106]
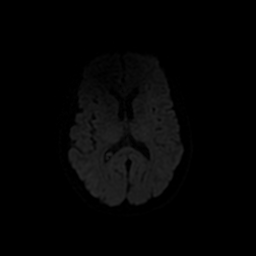
[im 71/106]
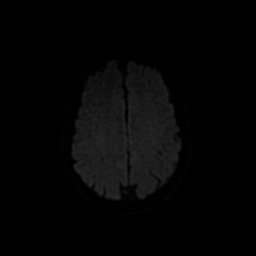
[im 94/106]
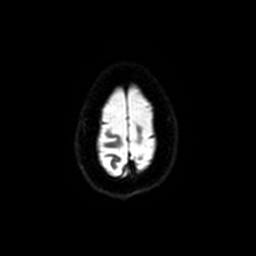
[im 106/106]
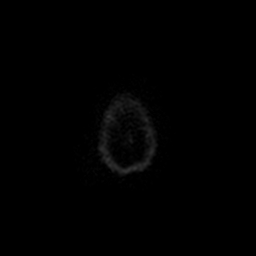

[Series 4: T1 · sagittal · 5.0mm · 0.47mm/px · 2 of 24 slices shown]
[im 1/24]
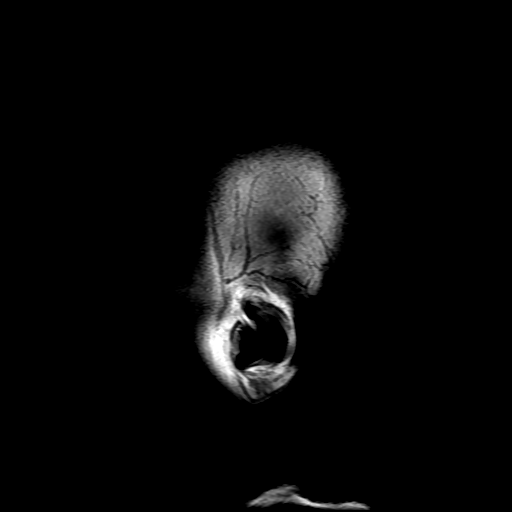
[im 24/24]
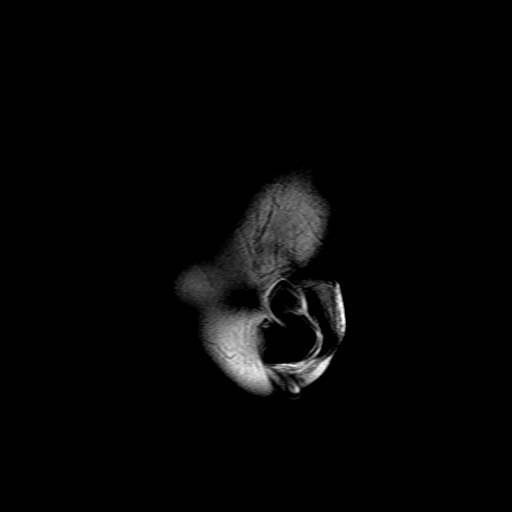

[Series 5: DWI · coronal · 4.0mm · 1.09mm/px · 8 of 72 slices shown (2 of 4)]
[im 1/72]
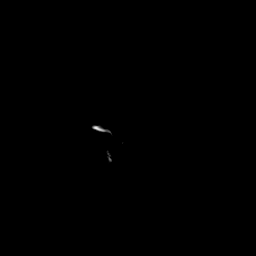
[im 11/72]
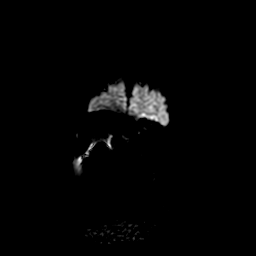
[im 21/72]
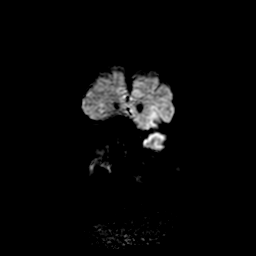
[im 31/72]
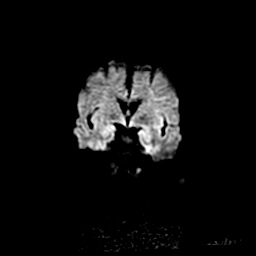
[im 41/72]
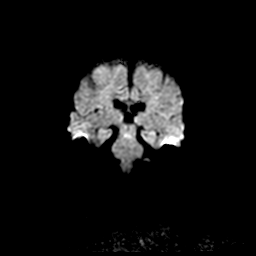
[im 51/72]
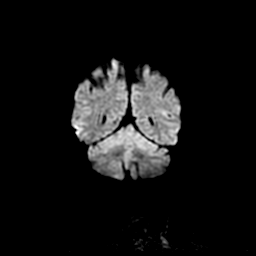
[im 61/72]
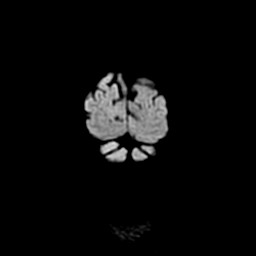
[im 72/72]
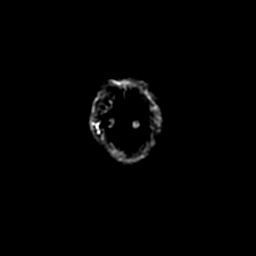

[Series 6: T2 · axial · 5.0mm · 0.43mm/px · z∈[-100,+53]mm · 3 of 24 slices shown (1 of 2)]
[im 1/24]
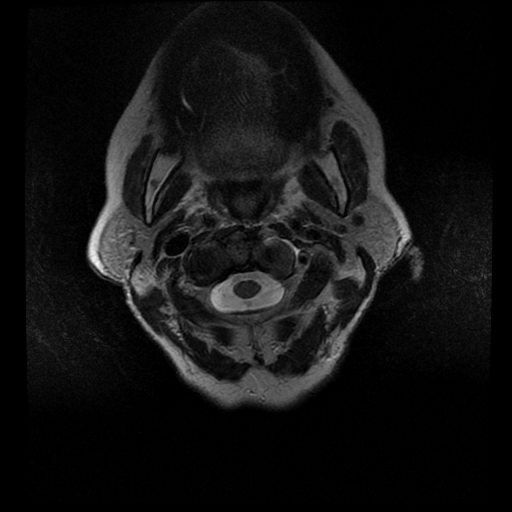
[im 12/24]
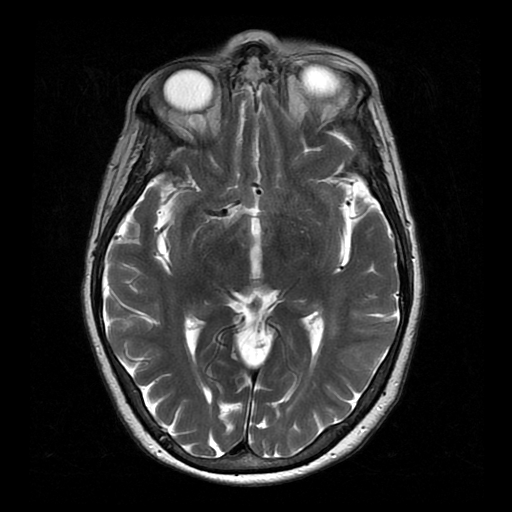
[im 24/24]
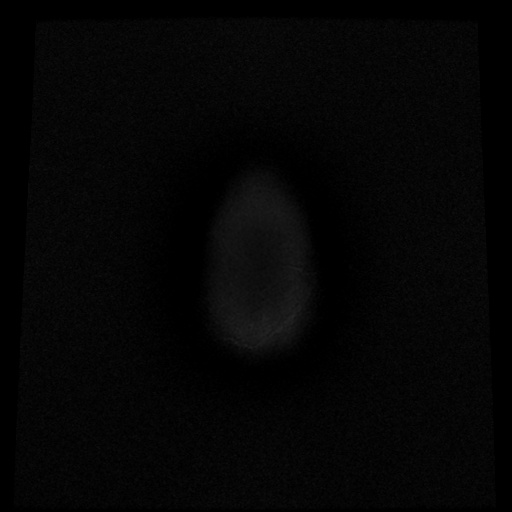

[Series 7: FLAIR · axial · 3.0mm · 0.43mm/px · z∈[-104,+57]mm · 3 of 29 slices shown]
[im 1/29]
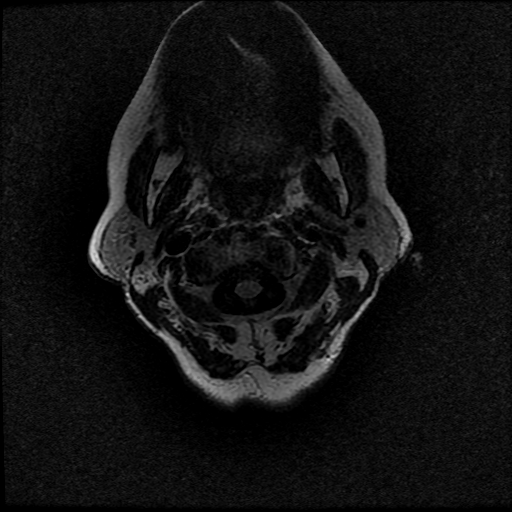
[im 15/29]
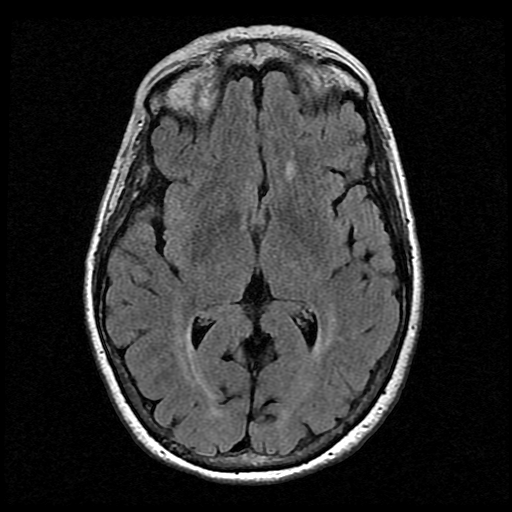
[im 29/29]
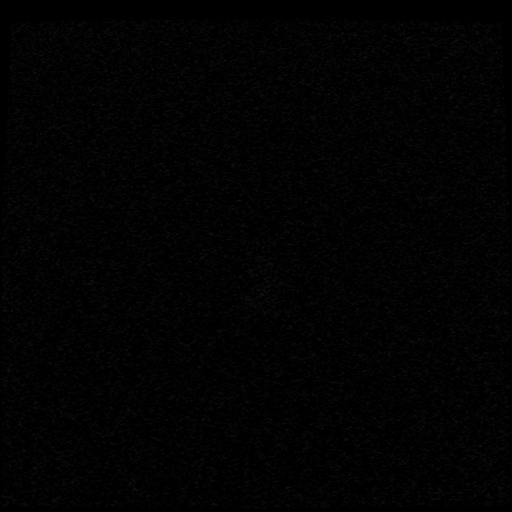

[Series 10: T2 · coronal · 5.0mm · 0.45mm/px · 3 of 29 slices shown (2 of 2)]
[im 1/29]
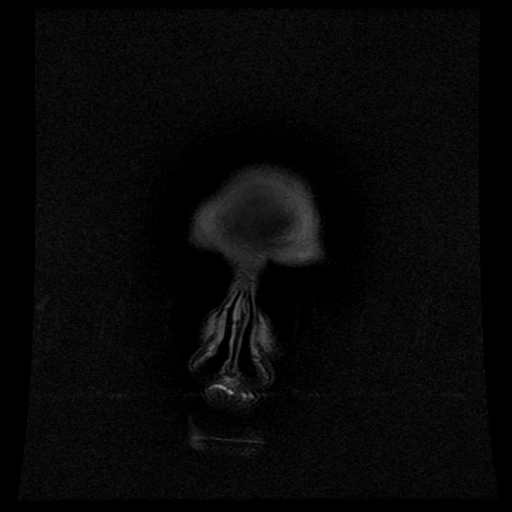
[im 15/29]
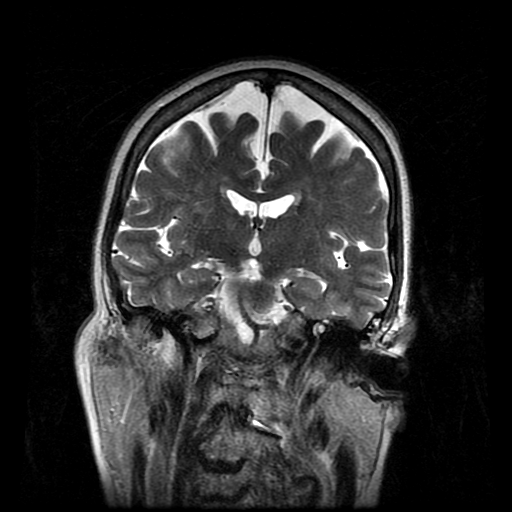
[im 29/29]
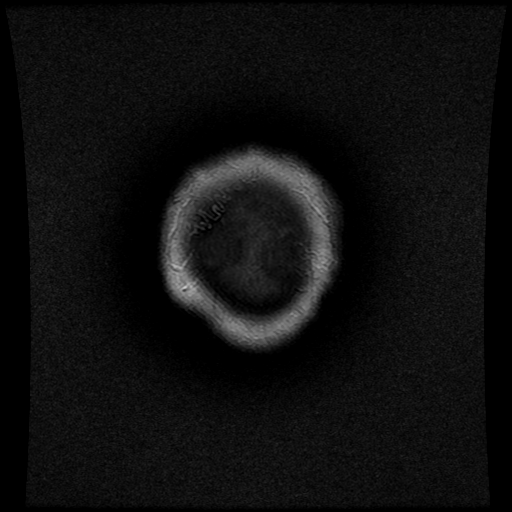

[Series 300: DWI · axial · 3.0mm · 1.09mm/px · z∈[-96,+56]mm · 6 of 53 slices shown (3 of 4)]
[im 1/53]
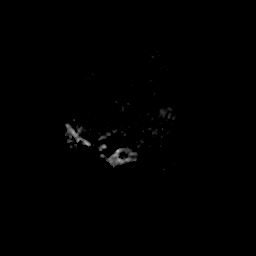
[im 11/53]
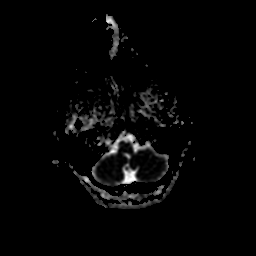
[im 21/53]
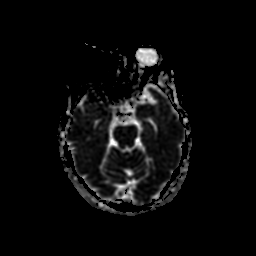
[im 32/53]
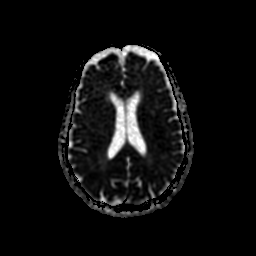
[im 42/53]
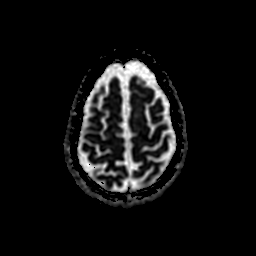
[im 53/53]
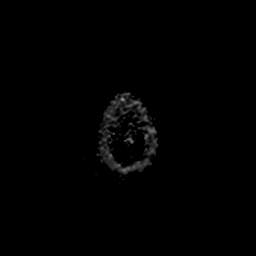

[Series 500: DWI · coronal · 4.0mm · 1.09mm/px · 4 of 36 slices shown (4 of 4)]
[im 1/36]
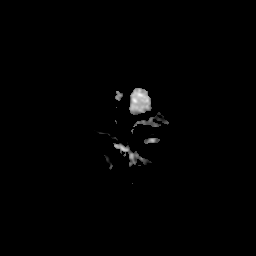
[im 12/36]
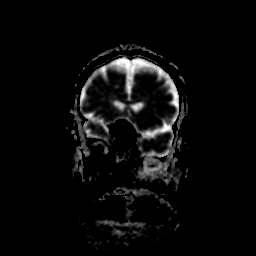
[im 24/36]
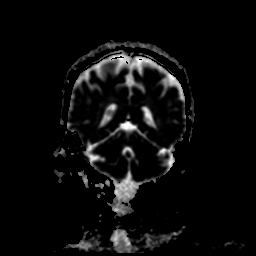
[im 36/36]
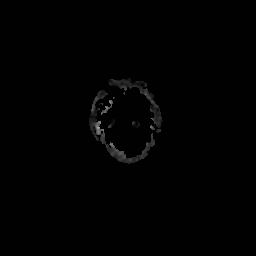

[37 of 48 positions shown; findings below may reference images not displayed]

FINDINGS: Brain: Diffusion imaging does not show any acute or subacute
infarction. The brainstem and cerebellum are normal. Cerebral
hemispheres show moderate chronic small-vessel ischemic changes of
the deep and subcortical white matter. No cortical or large vessel
territory infarction. No mass lesion, hemorrhage, hydrocephalus or
extra-axial collection.

Vascular: Major vessels at the base of the brain show flow.

Skull and upper cervical spine: Negative

Sinuses/Orbits: Sinuses are clear except for a retention cyst in the
left maxillary sinus. Orbits negative.

Other: None significant.
IMPRESSION: No acute or reversible finding. No abnormality seen to explain acute
dizziness. The study shows chronic atrophy with moderate chronic
appearing small vessel ischemic changes of the cerebral hemispheric
white matter.

## 2021-03-19 ENCOUNTER — Ambulatory Visit: Payer: Self-pay

## 2021-03-19 ENCOUNTER — Ambulatory Visit: Payer: Federal, State, Local not specified - PPO | Admitting: Orthopaedic Surgery

## 2021-03-19 ENCOUNTER — Encounter: Payer: Self-pay | Admitting: Orthopaedic Surgery

## 2021-03-19 ENCOUNTER — Other Ambulatory Visit: Payer: Self-pay

## 2021-03-19 DIAGNOSIS — M25561 Pain in right knee: Secondary | ICD-10-CM

## 2021-03-19 DIAGNOSIS — G8929 Other chronic pain: Secondary | ICD-10-CM

## 2021-03-19 MED ORDER — BUPIVACAINE HCL 0.5 % IJ SOLN
2.0000 mL | INTRAMUSCULAR | Status: AC | PRN
  Administered 2021-03-19: 2 mL via INTRA_ARTICULAR

## 2021-03-19 MED ORDER — METHYLPREDNISOLONE ACETATE 40 MG/ML IJ SUSP
40.0000 mg | INTRAMUSCULAR | Status: AC | PRN
  Administered 2021-03-19: 40 mg via INTRA_ARTICULAR

## 2021-03-19 MED ORDER — LIDOCAINE HCL 1 % IJ SOLN
2.0000 mL | INTRAMUSCULAR | Status: AC | PRN
  Administered 2021-03-19: 2 mL

## 2021-03-19 NOTE — Progress Notes (Signed)
Office Visit Note   Patient: Mindy Snyder           Date of Birth: [birth year]           MRN: [id number] Visit Date: 03/19/2021              Requested by: Dineen Kid, MD [address] [place],  [place] [address] PCP: Dineen Kid, MD   Assessment & Plan: Visit Diagnoses:  1. Chronic pain of right knee     Plan: Based on findings I feel that this is a flareup of CPPD arthropathy.  Her joint spaces are well preserved.  Not really reporting any mechanical symptoms to suggest meniscal injury.  Based on options she elected to do a cortisone injection and she will rest it for a couple weeks and then increase activity as tolerated.  She will follow-up if there is no improvement.  Follow-Up Instructions: No follow-ups on file.   Orders:  Orders Placed This Encounter  Procedures   XR KNEE 3 VIEW RIGHT   No orders of the defined types were placed in this encounter.     Procedures: Large Joint Inj: R knee on 03/19/2021 9:15 AM Indications: pain Details: 22 G needle  Arthrogram: No  Medications: 40 mg methylPREDNISolone acetate 40 MG/ML; 2 mL lidocaine 1 %; 2 mL bupivacaine 0.5 % Consent was given by the patient. Patient was prepped and draped in the usual sterile fashion.      Clinical Data: No additional findings.   Subjective: Chief Complaint  Patient presents with   Right Knee - Pain, Edema    Mindy Snyder is a 71 year old female who is the mother-in-law of Ziad who comes in for evaluation of 10 days of right knee pain of insidious onset.  She has been taking 400 mg ibuprofen twice a day and has noticed a little improvement.  She does play pickle ball quite a bit.  Denies a history of gout.  Denies any mechanical symptoms.  Denies any constitutional symptoms.   Review of Systems  Constitutional: Negative.   HENT: Negative.    Eyes: Negative.   Respiratory: Negative.    Cardiovascular: Negative.   Endocrine: Negative.   Musculoskeletal: Negative.   Neurological:  Negative.   Hematological: Negative.   Psychiatric/Behavioral: Negative.    All other systems reviewed and are negative.   Objective: Vital Signs: There were no vitals taken for this visit.  Physical Exam Vitals and nursing note reviewed.  Constitutional:      Appearance: She is well-developed.  Pulmonary:     Effort: Pulmonary effort is normal.  Skin:    General: Skin is warm.     Capillary Refill: Capillary refill takes less than 2 seconds.  Neurological:     Mental Status: She is alert and oriented to person, place, and time.  Psychiatric:        Behavior: Behavior normal.        Thought Content: Thought content normal.        Judgment: Judgment normal.    Ortho Exam  Examination of the right knee shows trace effusion.  Range of motion is preserved.  Collaterals and cruciates are stable.  No real joint line tenderness.  Specialty Comments:  No specialty comments available.  Imaging: XR KNEE 3 VIEW RIGHT  Result Date: 03/19/2021 Well preserved joint spaces.  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  pt has arthritis pain lower back, ankles, wrists and "everywhere"   Essential hypertension    History of diverticulitis of colon 01/2016   with perforation of sigmoid colon , no bleeding,  resolved without surgical  intervention   History of syncope 07/2016   ED visit---  and cardiologist-- dr Irish Lack note dated 11-24-2016 felt to be dehydration and HCTZ   Hyperlipidemia    Vertigo, benign paroxysmal    Wears glasses     Family History  Problem Relation Age of Onset   Hypertension Mother    Hyperlipidemia Mother    Hypertension Father    Diabetes Father    Hyperlipidemia Father     Past Surgical History:  Procedure Laterality Date   BREAST SURGERY  age 12   benign cyst removed    TOTAL HIP ARTHROPLASTY  03/23/2011   Procedure: TOTAL HIP ARTHROPLASTY;  Surgeon: Gearlean Alf, MD;  Location: [facility];  Service: Orthopedics;  Laterality: Left;   TOTAL HIP ARTHROPLASTY Right 09-11-2000  dr Telford Nab  @[facility]    TOTAL HIP REVISION Right 12/06/2017   Procedure: Right hip polyethylene;  Surgeon: Gaynelle Arabian, MD;  Location: [facility];  Service: Orthopedics;  Laterality: Right;   Social History   Occupational History   Not on file  Tobacco Use   Smoking status: Former    Packs/day: 1.00    Years: 25.00    Pack years: 25.00    Types: Cigarettes    Quit date: 11/29/2008    Years since quitting: 12.3   Smokeless tobacco: Never  Vaping Use   Vaping Use: Never used  Substance and Sexual Activity   Alcohol use: Yes    Alcohol/week: 7.0 standard drinks    Types: 7 Glasses of wine per week    Comment: 1  wine daily   Drug use: Never   Sexual activity: Not on file

## 2021-04-02 ENCOUNTER — Encounter: Payer: Self-pay | Admitting: Orthopaedic Surgery

## 2021-04-02 ENCOUNTER — Other Ambulatory Visit: Payer: Self-pay

## 2021-04-02 ENCOUNTER — Ambulatory Visit: Payer: Federal, State, Local not specified - PPO | Admitting: Orthopaedic Surgery

## 2021-04-02 VITALS — Ht 63.75 in | Wt 135.0 lb

## 2021-04-02 DIAGNOSIS — M25561 Pain in right knee: Secondary | ICD-10-CM | POA: Diagnosis not present

## 2021-04-02 DIAGNOSIS — G8929 Other chronic pain: Secondary | ICD-10-CM

## 2021-04-02 MED ORDER — PREDNISONE 10 MG (21) PO TBPK: ORAL_TABLET | ORAL | 3 refills | Status: AC

## 2021-04-02 NOTE — Progress Notes (Signed)
Office Visit Note   Patient: Mindy Snyder           Date of Birth: 1950/08/07           MRN: [id number] Visit Date: 04/02/2021              Requested by: Iva Boop, MD [address] [place],  [place] [address] PCP: Iva Boop, MD   Assessment & Plan: Visit Diagnoses:  1. Chronic pain of right knee     Plan: [name] returns today for recurrent right knee pain.  We saw her on 03/19/2021 and did a cortisone injection which helped for about a week but unfortunately the pain has returned.  She continues to have some swelling in the right knee.  Ibuprofen does help temporarily.  She feels okay today.  Examination of right knee shows small effusion.  Range of motion is well-preserved.  Impression is right knee pain with temporary relief from cortisone injection.  I explained that we can aspirate the effusion and do another cortisone injection or try a course of prednisone taper and rest and ice.  She would like to try the prednisone taper and she will rest it.  She will let me know if she does not feel any improvement.  Follow-Up Instructions: No follow-ups on file.   Orders:  No orders of the defined types were placed in this encounter.  Meds ordered this encounter  Medications   predniSONE (STERAPRED UNI-PAK 21 TAB) 10 MG (21) TBPK tablet    Sig: Take as directed    Dispense:  21 tablet    Refill:  3      Procedures: No procedures performed   Clinical Data: No additional findings.   Subjective: Chief Complaint  Patient presents with   Right Knee - Pain, Follow-up    HPI  Review of Systems   Objective: Vital Signs: Ht 5' 3.75" (1.619 m)    Wt 135 lb (61.2 kg)    BMI 23.35 kg/m   Physical Exam  Ortho Exam  Specialty Comments:  No specialty comments available.  Imaging: No results found.   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  pt has arthritis pain lower back, ankles, wrists and "everywhere"   Essential hypertension    History of diverticulitis of colon 01/2016   with perforation of sigmoid colon , no bleeding,  resolved without surgical intervention   History of syncope 07/2016   ED visit---  and cardiologist-- dr Eldridge Dace note dated 11-24-2016 felt to be dehydration and HCTZ   Hyperlipidemia    Vertigo, benign paroxysmal    Wears glasses     Family History  Problem Relation Age of Onset   Hypertension Mother    Hyperlipidemia Mother    Hypertension Father    Diabetes Father    Hyperlipidemia Father     Past Surgical History:  Procedure Laterality Date  BREAST SURGERY  age 30   benign cyst removed    TOTAL HIP ARTHROPLASTY  03/23/2011   Procedure: TOTAL HIP ARTHROPLASTY;  Surgeon: Loanne Drilling, MD;  Location: [facility];  Service: Orthopedics;  Laterality: Left;   TOTAL HIP ARTHROPLASTY Right 09-11-2000  dr rendall  @[facility]    TOTAL HIP REVISION Right 12/06/2017   Procedure: Right hip polyethylene;  Surgeon: 02/05/2018, MD;  Location: [facility];  Service: Orthopedics;  Laterality: Right;   Social History   Occupational History   Not on file  Tobacco Use   Smoking status: Former    Packs/day: 1.00    Years: 25.00    Pack years: 25.00    Types: Cigarettes    Quit date: 11/29/2008    Years since quitting: 12.3   Smokeless tobacco: Never  Vaping Use   Vaping Use: Never used  Substance and Sexual  Activity   Alcohol use: Yes    Alcohol/week: 7.0 standard drinks    Types: 7 Glasses of wine per week    Comment: 1  wine daily   Drug use: Never   Sexual activity: Not on file

## 2021-04-13 ENCOUNTER — Ambulatory Visit: Payer: Federal, State, Local not specified - PPO | Admitting: Orthopaedic Surgery

## 2021-04-19 ENCOUNTER — Encounter: Payer: Self-pay | Admitting: Orthopaedic Surgery

## 2021-04-19 MED ORDER — DICLOFENAC SODIUM 75 MG PO TBEC: 75.0000 mg | DELAYED_RELEASE_TABLET | Freq: Two times a day (BID) | ORAL | 0 refills | Status: DC

## 2021-04-19 NOTE — Telephone Encounter (Signed)
Mindy Oddi do you mind sending diclofenac 75 mg BID x 1 month please.  Thanks.

## 2021-05-16 ENCOUNTER — Other Ambulatory Visit: Payer: Self-pay | Admitting: Orthopaedic Surgery

## 2021-05-20 ENCOUNTER — Other Ambulatory Visit: Payer: Self-pay | Admitting: Physician Assistant

## 2021-05-20 ENCOUNTER — Encounter: Payer: Self-pay | Admitting: Orthopaedic Surgery

## 2021-05-20 NOTE — Telephone Encounter (Signed)
I'm happy to see her tomorrow am to aspirate her knee if she would like

## 2021-05-21 ENCOUNTER — Encounter: Payer: Self-pay | Admitting: Physician Assistant

## 2021-05-21 ENCOUNTER — Other Ambulatory Visit: Payer: Self-pay

## 2021-05-21 ENCOUNTER — Ambulatory Visit (INDEPENDENT_AMBULATORY_CARE_PROVIDER_SITE_OTHER): Payer: Federal, State, Local not specified - PPO | Admitting: Physician Assistant

## 2021-05-21 DIAGNOSIS — G8929 Other chronic pain: Secondary | ICD-10-CM

## 2021-05-21 DIAGNOSIS — M25561 Pain in right knee: Secondary | ICD-10-CM | POA: Diagnosis not present

## 2021-05-21 MED ORDER — LIDOCAINE HCL 1 % IJ SOLN
2.0000 mL | INTRAMUSCULAR | Status: AC | PRN
  Administered 2021-05-21: 2 mL

## 2021-05-21 MED ORDER — BUPIVACAINE HCL 0.25 % IJ SOLN
2.0000 mL | INTRAMUSCULAR | Status: AC | PRN
  Administered 2021-05-21: 2 mL via INTRA_ARTICULAR

## 2021-05-21 MED ORDER — KETOROLAC TROMETHAMINE 30 MG/ML IJ SOLN: 30.0000 mg | Freq: Once | INTRAMUSCULAR | Status: AC

## 2021-05-21 NOTE — Progress Notes (Signed)
? ?Office Visit Note ?  ?Patient: Mindy Snyder           ?Date of Birth: [birth year]           ?MRN: [id number] ?Visit Date: 05/21/2021 ?             ?Requested by: Iva Boop, MD ?[address] ?[place],  [place] [address] ?PCP: Iva Boop, MD ? ? ?Assessment & Plan: ?Visit Diagnoses:  ?1. Chronic pain of right knee   ? ? ?Plan: Impression is chronic right knee pain with underlying pseudogout.  We have discussed aspirating the right knee today and injecting this with Toradol as it is too early to reinject with cortisone.  Approximately 25 cc of blood-tinged fluid was aspirated from the right knee.  I have also discussed with her that I will speak to Dr. Deno Etienne to see if an MRI is warranted at this time.  I will let her know once I hear back from him.  Otherwise, follow-up with [place] as needed. ? ?Follow-Up Instructions: Return if symptoms worsen or fail to improve.  ? ?Orders:  ?Orders Placed This Encounter  ?Procedures  ? Large Joint Inj  ? ?Meds ordered this encounter  ?Medications  ? ketorolac (TORADOL) 30 MG/ML injection 30 mg  ? ? ? ? Procedures: ?Large Joint Inj: R knee on 05/21/2021 10:01 AM ?Indications: pain ?Details: 22 G needle, anterolateral approach ?Medications: 2 mL lidocaine 1 %; 2 mL bupivacaine 0.25 % ? ? ? ? ?Clinical Data: ?No additional findings. ? ? ?Subjective: ?Chief Complaint  ?Patient presents with  ? Right Knee - Pain  ? ? ?HPI patient is a pleasant 71 year old female who comes in today with continued right knee pain.  She was seen in our office in January for this where she was diagnosed with CPPD.  Right knee was injected with cortisone.  She had about a weeks relief.  She was seen again last month where she was started on a steroid taper.  She did not notice relief from this.  She has been taking NSAIDs without relief.  She continues to endorse pain and swelling to the right lateral knee.  She denies any locking, catching or instability.  Any activity seems to worsen her  symptoms. ? ?Review of Systems as detailed in HPI.  All others reviewed and are negative. ? ? ?Objective: ?Vital Signs: There were no vitals taken for this visit. ? ?Physical Exam well-developed well-nourished female no acute distress.  Alert and oriented x3. ? ?Ortho Exam right knee exam shows a small effusion.  Range of motion 0 to 115 degrees.  Slight lateral joint line tenderness.  She is neurovascular intact distally. ? ?Specialty Comments:  ?No specialty comments available. ? ?Imaging: ?No new imaging ? ? ?PMFS History: ?Patient Active Problem List  ? Diagnosis Date Noted  ? 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 Vertigo 08/25/2017  ? HLD (hyperlipidemia) 08/24/2017  ? Diverticulitis 02/02/2016  ? Perforation of sigmoid colon due to diverticulitis 02/02/2016  ? Essential hypertension 02/02/2016  ? Hypokalemia 02/02/2016  ? Leukocytosis 02/02/2016  ? Postop Transfusion history 04/06/2011  ? Postop Acute blood loss anemia 03/25/2011  ? Postop Hyponatremia 03/24/2011  ? Osteoarthritis of hip 03/23/2011  ? ?Past Medical History:  ?Diagnosis Date  ? Arthritis   ? pain and oa left hip-plans hip replacement  -- s/p right hip replacement -doing well.  pt has arthritis pain lower back, ankles, wrists and "everywhere"  ? Essential hypertension   ?  History of diverticulitis of colon 01/2016  ? with perforation of sigmoid colon , no bleeding,  resolved without surgical intervention  ? History of syncope 07/2016  ? ED visit---  and cardiologist-- dr Eldridge Dace note dated 11-24-2016 felt to be dehydration and HCTZ  ? Hyperlipidemia   ? Vertigo, benign paroxysmal   ? Wears glasses   ?  ?Family History  ?Problem Relation Age of Onset  ? Hypertension Mother   ? Hyperlipidemia Mother   ? Hypertension Father   ? Diabetes Father   ? Hyperlipidemia Father   ?  ?Past Surgical History:  ?Procedure Laterality Date  ? BREAST SURGERY  age 17  ? benign cyst removed   ? TOTAL HIP ARTHROPLASTY  03/23/2011  ? Procedure: TOTAL HIP  ARTHROPLASTY;  Surgeon: Loanne Drilling, MD;  Location: [facility];  Service: Orthopedics;  Laterality: Left;  ? TOTAL HIP ARTHROPLASTY Right 09-11-2000  dr rendall  @[facility]   ? TOTAL HIP REVISION Right 12/06/2017  ? Procedure: Right hip polyethylene;  Surgeon: 02/05/2018, MD;  Location: [facility];  Service: Orthopedics;  Laterality: Right;  ? ?Social History  ? ?Occupational History  ? Not on file  ?Tobacco Use  ? Smoking status: Former  ?  Packs/day: 1.00  ?  Years: 25.00  ?  Pack years: 25.00  ?  Types: Cigarettes  ?  Quit date: 11/29/2008  ?  Years since quitting: 12.4  ? Smokeless tobacco: Never  ?Vaping Use  ? Vaping Use: Never used  ?Substance and Sexual Activity  ? Alcohol use: Yes  ?  Alcohol/week: 7.0 standard drinks  ?  Types: 7 Glasses of wine per week  ?  Comment: 1  wine daily  ? Drug use: Never  ? Sexual activity: Not on file  ? ? ? ? ? ? ?

## 2021-05-21 NOTE — Telephone Encounter (Signed)
Appt made

## 2021-05-21 NOTE — Addendum Note (Signed)
Addended by: Albertina Parr on: 05/21/2021 10:36 AM ? ? Modules accepted: Orders ? ?

## 2021-05-23 ENCOUNTER — Encounter: Payer: Self-pay | Admitting: Orthopaedic Surgery

## 2021-05-23 LAB — SYNOVIAL FLUID ANALYSIS, COMPLETE
Basophils, %: 0 %
Eosinophils-Synovial: 0 % (ref 0–2)
Lymphocytes-Synovial Fld: 57 % (ref 0–74)
Monocyte/Macrophage: 20 % (ref 0–69)
Neutrophil, Synovial: 23 % (ref 0–24)
Synoviocytes, %: 0 % (ref 0–15)
WBC, Synovial: 324 cells/uL — ABNORMAL HIGH (ref <150)

## 2021-05-23 LAB — TIQ-NTM

## 2021-05-24 ENCOUNTER — Other Ambulatory Visit: Payer: Self-pay

## 2021-05-24 DIAGNOSIS — G8929 Other chronic pain: Secondary | ICD-10-CM

## 2021-05-30 ENCOUNTER — Ambulatory Visit
Admission: RE | Admit: 2021-05-30 | Discharge: 2021-05-30 | Disposition: A | Discharge status: Home / Self Care | Payer: Federal, State, Local not specified - PPO | Source: Ambulatory Visit | Attending: Orthopaedic Surgery | Admitting: Orthopaedic Surgery

## 2021-05-30 DIAGNOSIS — G8929 Other chronic pain: Secondary | ICD-10-CM

## 2021-05-31 ENCOUNTER — Other Ambulatory Visit: Payer: Federal, State, Local not specified - PPO

## 2021-06-03 ENCOUNTER — Encounter: Payer: Self-pay | Admitting: Orthopaedic Surgery

## 2021-06-03 ENCOUNTER — Ambulatory Visit: Payer: Federal, State, Local not specified - PPO | Admitting: Orthopaedic Surgery

## 2021-06-03 DIAGNOSIS — S83281A Other tear of lateral meniscus, current injury, right knee, initial encounter: Secondary | ICD-10-CM

## 2021-06-03 NOTE — Progress Notes (Signed)
? ?Office Visit Note ?  ?Patient: Mindy Snyder           ?Date of Birth: 12/15/1950           ?MRN: [id number] ?Visit Date: 06/03/2021 ?             ?Requested by: Iva Boop, MD ?[address] ?[place],  [place] [address] ?PCP: Iva Boop, MD ? ? ?Assessment & Plan: ?Visit Diagnoses:  ?1. Acute lateral meniscus tear of right knee, initial encounter   ? ? ?Plan: Rand returns today to discuss MRI on the right knee.  Reports similar symptoms which have not improved. ? ?Examination right knee shows large joint effusion.  Lateral joint line tenderness.  Pain with McMurray testing.  Exam is unchanged from prior visits. ? ?MRI of the right knee shows a large macerated complex tear of the lateral meniscus.  Mild chondrosis of the knee.  Internal degeneration of the medial meniscus without a frank tear.  These findings were reviewed with Myriam Jacobson in detail and treatment options were discussed.  She will likely elect to undergo arthroscopic partial lateral meniscectomy but she will get back in touch with me when she is ready to schedule.  For now she will use a knee brace and activity modification in order to manage the pain. ? ? ?Follow-Up Instructions: No follow-ups on file.  ? ?Orders:  ?No orders of the defined types were placed in this encounter. ? ?No orders of the defined types were placed in this encounter. ? ? ? ? Procedures: ?No procedures performed ? ? ?Clinical Data: ?No additional findings. ? ? ?Subjective: ?Chief Complaint  ?Patient presents with  ? Right Knee - Follow-up  ?  MRI review  ? ? ?HPI ? ?Review of Systems ? ? ?Objective: ?Vital Signs: There were no vitals taken for this visit. ? ?Physical Exam ? ?Ortho Exam ? ?Specialty Comments:  ?No specialty comments available. ? ?Imaging: ?No results found. ? ? ?PMFS History: ?Patient Active Problem List  ? Diagnosis Date Noted  ? 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 Vertigo 08/25/2017  ? HLD (hyperlipidemia) 08/24/2017  ? Diverticulitis  02/02/2016  ? Perforation of sigmoid colon due to diverticulitis 02/02/2016  ? Essential hypertension 02/02/2016  ? Hypokalemia 02/02/2016  ? Leukocytosis 02/02/2016  ? Postop Transfusion history 04/06/2011  ? Postop Acute blood loss anemia 03/25/2011  ? Postop Hyponatremia 03/24/2011  ? Osteoarthritis of hip 03/23/2011  ? ?Past Medical History:  ?Diagnosis Date  ? Arthritis   ? pain and oa left hip-plans hip replacement  -- s/p right hip replacement -doing well.  pt has arthritis pain lower back, ankles, wrists and "everywhere"  ? Essential hypertension   ? History of diverticulitis of colon 01/2016  ? with perforation of sigmoid colon , no bleeding,  resolved without surgical intervention  ? History of syncope 07/2016  ? ED visit---  and cardiologist-- dr Eldridge Dace note dated 11-24-2016 felt to be dehydration and HCTZ  ? Hyperlipidemia   ? Vertigo, benign paroxysmal   ? Wears glasses   ?  ?Family History  ?Problem Relation Age of Onset  ? Hypertension Mother   ? Hyperlipidemia Mother   ? Hypertension Father   ? Diabetes Father   ? Hyperlipidemia Father   ?  ?Past Surgical History:  ?Procedure Laterality Date  ? BREAST SURGERY  age 18  ? benign cyst removed   ? TOTAL HIP ARTHROPLASTY  03/23/2011  ? Procedure: TOTAL HIP ARTHROPLASTY;  Surgeon: Loanne Drilling, MD;  Location: [facility];  Service: Orthopedics;  Laterality: Left;  ? TOTAL HIP ARTHROPLASTY Right 09-11-2000  dr rendall  @[facility]   ? TOTAL HIP REVISION Right 12/06/2017  ? Procedure: Right hip polyethylene;  Surgeon: 02/05/2018, MD;  Location: [facility];  Service: Orthopedics;  Laterality: Right;  ? ?Social History  ? ?Occupational History  ? Not on file  ?Tobacco Use  ? Smoking status: Former  ?  Packs/day: 1.00  ?  Years: 25.00  ?  Pack years: 25.00  ?  Types: Cigarettes  ?  Quit date: 11/29/2008  ?  Years since quitting: 12.5  ? Smokeless tobacco: Never  ?Vaping Use  ? Vaping Use: Never used  ?Substance and Sexual Activity  ? Alcohol use: Yes  ?  Alcohol/week:  7.0 standard drinks  ?  Types: 7 Glasses of wine per week  ?  Comment: 1  wine daily  ? Drug use: Never  ? Sexual activity: Not on file  ? ? ? ? ? ? ?

## 2021-06-07 ENCOUNTER — Encounter: Payer: Self-pay | Admitting: Orthopaedic Surgery

## 2021-06-08 NOTE — Telephone Encounter (Signed)
Yes, I have a surgery sheet for this patient.  RIGHT KNEE PARTIAL LATERAL MENISCECTOMY  (dated 06-03-21).  I will be working on Dr. Warren Danes schedule on Thursday and Friday of this week.   ?

## 2021-06-08 NOTE — Telephone Encounter (Signed)
Marisue Ivan, ok to schedule.  He will likely debride lateral meniscus as well as medial meniscus if indicated once he gets inside with scope.   ? ?Eunice Blase, do you have a surgery sheet?

## 2021-06-08 NOTE — Telephone Encounter (Signed)
Ok, great! Thank you

## 2021-06-17 ENCOUNTER — Other Ambulatory Visit: Payer: Self-pay | Admitting: Physician Assistant

## 2021-06-18 ENCOUNTER — Telehealth: Payer: Self-pay | Admitting: Orthopaedic Surgery

## 2021-06-18 NOTE — Telephone Encounter (Signed)
FYI:  ? ?Patient is cancelling her right knee PLM at [facility] scheduled for  06-24-21. She is currently taking care of her 73 year old father and would prefer to wait.  Patient has my direct number and call when she is ready to get back on the schedule.  ?

## 2021-06-24 ENCOUNTER — Other Ambulatory Visit: Payer: Self-pay | Admitting: Physician Assistant

## 2021-06-24 MED ORDER — HYDROCODONE-ACETAMINOPHEN 5-325 MG PO TABS: 1.0000 | ORAL_TABLET | Freq: Four times a day (QID) | ORAL | 0 refills | Status: DC | PRN

## 2021-06-24 MED ORDER — ONDANSETRON HCL 4 MG PO TABS: 4.0000 mg | ORAL_TABLET | Freq: Three times a day (TID) | ORAL | 0 refills | Status: DC | PRN

## 2021-07-01 ENCOUNTER — Encounter: Payer: Federal, State, Local not specified - PPO | Admitting: Orthopaedic Surgery

## 2021-07-28 ENCOUNTER — Telehealth: Payer: Self-pay | Admitting: Orthopaedic Surgery

## 2021-07-28 NOTE — Telephone Encounter (Signed)
Returned patient's call regarding setting up surgery date for right knee arthroscopy with Dr. Roda Shutters. Left patient my name and direct number.

## 2021-08-12 ENCOUNTER — Other Ambulatory Visit: Payer: Self-pay | Admitting: Physician Assistant

## 2021-08-12 MED ORDER — HYDROCODONE-ACETAMINOPHEN 5-325 MG PO TABS: 1.0000 | ORAL_TABLET | Freq: Three times a day (TID) | ORAL | 0 refills | Status: AC | PRN

## 2021-08-12 MED ORDER — ONDANSETRON HCL 4 MG PO TABS: 4.0000 mg | ORAL_TABLET | Freq: Three times a day (TID) | ORAL | 0 refills | Status: AC | PRN

## 2021-08-19 ENCOUNTER — Other Ambulatory Visit: Payer: Self-pay | Admitting: Physician Assistant

## 2021-08-19 ENCOUNTER — Encounter: Payer: Self-pay | Admitting: Orthopaedic Surgery

## 2021-08-19 DIAGNOSIS — M659 Synovitis and tenosynovitis, unspecified: Secondary | ICD-10-CM

## 2021-08-19 DIAGNOSIS — S83281A Other tear of lateral meniscus, current injury, right knee, initial encounter: Secondary | ICD-10-CM

## 2021-08-20 ENCOUNTER — Encounter: Payer: Self-pay | Admitting: Orthopaedic Surgery

## 2021-08-26 ENCOUNTER — Ambulatory Visit (INDEPENDENT_AMBULATORY_CARE_PROVIDER_SITE_OTHER): Payer: Federal, State, Local not specified - PPO | Admitting: Orthopaedic Surgery

## 2021-08-26 DIAGNOSIS — S83281A Other tear of lateral meniscus, current injury, right knee, initial encounter: Secondary | ICD-10-CM

## 2021-08-26 DIAGNOSIS — M1711 Unilateral primary osteoarthritis, right knee: Secondary | ICD-10-CM

## 2021-08-26 MED ORDER — TRAMADOL HCL 50 MG PO TABS: 50.0000 mg | ORAL_TABLET | Freq: Every evening | ORAL | 0 refills | Status: AC | PRN

## 2021-08-26 NOTE — Progress Notes (Signed)
Office Visit Note   Patient: Mindy Snyder           Date of Birth: August 22, 1950           MRN: [id number] Visit Date: 08/26/2021              Requested by: Iva Boop, MD [address] [place],  [place] [address] PCP: Iva Boop, MD   Assessment & Plan: Visit Diagnoses:  1. Acute lateral meniscus tear of right knee, initial encounter   2. Primary osteoarthritis of right knee     Plan: Latisa is 1 week status post right knee lateral meniscal debridement.  Overall doing okay.  No real complaints other than some time discomfort.  Has been taking Tylenol and night.  Examination of right knee shows healed surgical incisions.  Moderate joint effusion with surrounding swelling and ecchymosis.  No neurovascular compromise.  Range of motion is limited due to discomfort.  No calf tenderness.  Sutures removed Steri-Strips applied.  Home exercises provided.  Handicap placard for 6 months.  We will try tramadol at nighttime to see if this will work better.  Continue with ice and limit activity.  Recheck in 3 weeks.  Follow-Up Instructions: No follow-ups on file.   Orders:  No orders of the defined types were placed in this encounter.  Meds ordered this encounter  Medications   traMADol (ULTRAM) 50 MG tablet    Sig: Take 1-2 tablets (50-100 mg total) by mouth at bedtime as needed.    Dispense:  20 tablet    Refill:  0      Procedures: No procedures performed   Clinical Data: No additional findings.   Subjective: Chief Complaint  Patient presents with   Right Knee - Routine Post Op    HPI  Review of Systems   Objective: Vital Signs: There were no vitals taken for this visit.  Physical Exam  Ortho Exam  Specialty Comments:  No specialty comments available.  Imaging: No results found.   PMFS History: Patient Active Problem List   Diagnosis Date Noted   Acute lateral meniscus tear of right knee 08/26/2021   Primary osteoarthritis of right knee 08/26/2021    Failed total hip arthroplasty ([facility]CC) 12/06/2017   Vertigo 08/25/2017   HLD (hyperlipidemia) 08/24/2017   Diverticulitis 02/02/2016   Perforation of sigmoid colon due to diverticulitis 02/02/2016   Essential hypertension 02/02/2016   Hypokalemia 02/02/2016   Leukocytosis 02/02/2016   Postop Transfusion history 04/06/2011   Postop Acute blood loss anemia 03/25/2011   Postop Hyponatremia 03/24/2011   Osteoarthritis of hip 03/23/2011   Past Medical History:  Diagnosis Date   Arthritis    pain and oa left hip-plans hip replacement  -- s/p right hip replacement -doing well.  pt has arthritis pain lower back, ankles, wrists and "everywhere"   Essential hypertension    History of diverticulitis of colon 01/2016   with perforation of sigmoid colon , no bleeding,  resolved without surgical intervention   History of syncope 07/2016   ED visit---  and cardiologist-- dr Eldridge Dace note dated 11-24-2016 felt to be dehydration and HCTZ   Hyperlipidemia    Vertigo, benign paroxysmal    Wears glasses     Family History  Problem Relation Age of Onset   Hypertension Mother    Hyperlipidemia Mother    Hypertension Father    Diabetes Father    Hyperlipidemia Father     Past Surgical History:  Procedure Laterality Date  BREAST SURGERY  age 74   benign cyst removed    TOTAL HIP ARTHROPLASTY  03/23/2011   Procedure: TOTAL HIP ARTHROPLASTY;  Surgeon: Loanne Drilling, MD;  Location: [facility];  Service: Orthopedics;  Laterality: Left;   TOTAL HIP ARTHROPLASTY Right 09-11-2000  dr rendall  @[facility]    TOTAL HIP REVISION Right 12/06/2017   Procedure: Right hip polyethylene;  Surgeon: 02/05/2018, MD;  Location: [facility];  Service: Orthopedics;  Laterality: Right;   Social History   Occupational History   Not on file  Tobacco Use   Smoking status: Former    Packs/day: 1.00    Years: 25.00    Total pack years: 25.00    Types: Cigarettes    Quit date: 11/29/2008    Years since quitting: 12.7    Smokeless tobacco: Never  Vaping Use   Vaping Use: Never used  Substance and Sexual Activity   Alcohol use: Yes    Alcohol/week: 7.0 standard drinks of alcohol    Types: 7 Glasses of wine per week    Comment: 1  wine daily   Drug use: Never   Sexual activity: Not on file

## 2021-09-16 ENCOUNTER — Encounter: Payer: Self-pay | Admitting: Orthopaedic Surgery

## 2021-09-16 ENCOUNTER — Ambulatory Visit (INDEPENDENT_AMBULATORY_CARE_PROVIDER_SITE_OTHER): Payer: Federal, State, Local not specified - PPO | Admitting: Orthopaedic Surgery

## 2021-09-16 DIAGNOSIS — S83281A Other tear of lateral meniscus, current injury, right knee, initial encounter: Secondary | ICD-10-CM

## 2021-09-16 NOTE — Progress Notes (Signed)
Post-Op Visit Note   Patient: Mindy Snyder           Date of Birth: May 10, 1950           MRN: [id number] Visit Date: 09/16/2021 PCP: Iva Boop, MD   Assessment & Plan:  Chief Complaint:  Chief Complaint  Patient presents with   Right Knee - Follow-up   Visit Diagnoses:  1. Acute lateral meniscus tear of right knee, initial encounter     Plan: Mindy Snyder is status post right knee partial lateral meniscectomy.  She is approximately 4 to 5 weeks from the surgery.  Overall things are improving and increasing her activity.  Feels some residual swelling of the knee.  The bruising has pretty much completely resolved.  Examination right knee shows fully healed surgical scars.  Small joint effusion.  No signs of infection.  Range of motion is 0 to greater than 120 degrees of flexion.  Mindy Snyder is recovering from the surgery well.  She will steadily increase activity as tolerated.  Recommend taking Advil on a daily basis for the next 1 to 2 weeks for the residual inflammation.  Follow-up if she has any persistent symptoms.  Follow-Up Instructions: No follow-ups on file.   Orders:  No orders of the defined types were placed in this encounter.  No orders of the defined types were placed in this encounter.   Imaging: No results found.  PMFS History: Patient Active Problem List   Diagnosis Date Noted   Acute lateral meniscus tear of right knee 08/26/2021   Primary osteoarthritis of right knee 08/26/2021   Failed total hip arthroplasty (HCC) 12/06/2017   Vertigo 08/25/2017   HLD (hyperlipidemia) 08/24/2017   Diverticulitis 02/02/2016   Perforation of sigmoid colon due to diverticulitis 02/02/2016   Essential hypertension 02/02/2016   Hypokalemia 02/02/2016   Leukocytosis 02/02/2016   Postop Transfusion history 04/06/2011   Postop Acute blood loss anemia 03/25/2011   Postop Hyponatremia 03/24/2011   Osteoarthritis of hip 03/23/2011   Past Medical History:  Diagnosis Date    Arthritis    pain and oa left hip-plans hip replacement  -- s/p right hip replacement -doing well.  pt has arthritis pain lower back, ankles, wrists and "everywhere"   Essential hypertension    History of diverticulitis of colon 01/2016   with perforation of sigmoid colon , no bleeding,  resolved without surgical intervention   History of syncope 07/2016   ED visit---  and cardiologist-- dr Eldridge Dace note dated 11-24-2016 felt to be dehydration and HCTZ   Hyperlipidemia    Vertigo, benign paroxysmal    Wears glasses     Family History  Problem Relation Age of Onset   Hypertension Mother    Hyperlipidemia Mother    Hypertension Father    Diabetes Father    Hyperlipidemia Father     Past Surgical History:  Procedure Laterality Date   BREAST SURGERY  age 66   benign cyst removed    TOTAL HIP ARTHROPLASTY  03/23/2011   Procedure: TOTAL HIP ARTHROPLASTY;  Surgeon: Loanne Drilling, MD;  Location: [facility];  Service: Orthopedics;  Laterality: Left;   TOTAL HIP ARTHROPLASTY Right 09-11-2000  dr Priscille Kluver  @[facility]    TOTAL HIP REVISION Right 12/06/2017   Procedure: Right hip polyethylene;  Surgeon: 02/05/2018, MD;  Location: [facility];  Service: Orthopedics;  Laterality: Right;   Social History   Occupational History   Not on file  Tobacco Use   Smoking status: Former  Packs/day: 1.00    Years: 25.00    Total pack years: 25.00    Types: Cigarettes    Quit date: 11/29/2008    Years since quitting: 12.8   Smokeless tobacco: Never  Vaping Use   Vaping Use: Never used  Substance and Sexual Activity   Alcohol use: Yes    Alcohol/week: 7.0 standard drinks of alcohol    Types: 7 Glasses of wine per week    Comment: 1  wine daily   Drug use: Never   Sexual activity: Not on file

## 2022-08-24 ENCOUNTER — Other Ambulatory Visit (INDEPENDENT_AMBULATORY_CARE_PROVIDER_SITE_OTHER): Payer: Federal, State, Local not specified - PPO

## 2022-08-24 ENCOUNTER — Ambulatory Visit: Payer: Federal, State, Local not specified - PPO | Admitting: Orthopaedic Surgery

## 2022-08-24 ENCOUNTER — Encounter: Payer: Self-pay | Admitting: Orthopaedic Surgery

## 2022-08-24 DIAGNOSIS — M25571 Pain in right ankle and joints of right foot: Secondary | ICD-10-CM

## 2022-08-24 NOTE — Progress Notes (Signed)
Office Visit Note   Patient: Mindy Snyder           Date of Birth: 07/30/1950           MRN: [id number] Visit Date: 08/24/2022              Requested by: Iva Boop, MD [address] [place],  [place] [address] PCP: Iva Boop, MD   Assessment & Plan: Visit Diagnoses:  1. Pain in right ankle and joints of right foot     Plan: Mindy Snyder is a 71 year old female with an incomplete distal fibula fracture of the right ankle.  This injury is now 76 weeks old.  Her symptoms are quite mild.  We will immobilize an ASO brace for another few weeks and then she can wean as tolerated as well as advance her activity as tolerated.  She does not need follow-up unless her symptoms worsen.  Follow-Up Instructions: No follow-ups on file.   Orders:  Orders Placed This Encounter  Procedures   XR Ankle Complete Right   No orders of the defined types were placed in this encounter.     Procedures: No procedures performed   Clinical Data: No additional findings.   Subjective: Chief Complaint  Patient presents with   Right Ankle - Injury    DOI 07/31/2022    HPI Mindy Snyder comes in today to be evaluated for right ankle pain and swelling since June 2 when she was struck on the lateral aspect of her ankle by a baseball at her grandson's baseball game.  She has been able to wear regular shoes.  She has had continued mild pain and swelling and wants to get this checked out. Review of Systems  Constitutional: Negative.   HENT: Negative.    Eyes: Negative.   Respiratory: Negative.    Cardiovascular: Negative.   Endocrine: Negative.   Musculoskeletal: Negative.   Neurological: Negative.   Hematological: Negative.   Psychiatric/Behavioral: Negative.    All other systems reviewed and are negative.    Objective: Vital Signs: There were no vitals taken for this visit.  Physical Exam Vitals and nursing note reviewed.  Constitutional:      Appearance: She is well-developed.  HENT:      Head: Normocephalic and atraumatic.  Pulmonary:     Effort: Pulmonary effort is normal.  Abdominal:     Palpations: Abdomen is soft.  Musculoskeletal:     Cervical back: Neck supple.  Skin:    General: Skin is warm.     Capillary Refill: Capillary refill takes less than 2 seconds.  Neurological:     Mental Status: She is alert and oriented to person, place, and time.  Psychiatric:        Behavior: Behavior normal.        Thought Content: Thought content normal.        Judgment: Judgment normal.     Ortho Exam Examination right ankle shows mild swelling over the distal fibula.  She has slight tenderness of the distal fibula.  No neurovascular compromise.  Normal ankle range of motion. Specialty Comments:  No specialty comments available.  Imaging: No results found.   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  pt has arthritis pain lower back, ankles, wrists and "everywhere"   Essential hypertension    History of diverticulitis of colon 01/2016   with perforation of sigmoid colon , no bleeding,  resolved without surgical intervention   History of syncope 07/2016   ED visit---  and cardiologist-- dr Eldridge Dace note dated 11-24-2016 felt to be dehydration and HCTZ    Hyperlipidemia    Vertigo, benign paroxysmal    Wears glasses     Family History  Problem Relation Age of Onset   Hypertension Mother    Hyperlipidemia Mother    Hypertension Father    Diabetes Father    Hyperlipidemia Father     Past Surgical History:  Procedure Laterality Date   BREAST SURGERY  age 73   benign cyst removed    TOTAL HIP ARTHROPLASTY  03/23/2011   Procedure: TOTAL HIP ARTHROPLASTY;  Surgeon: Loanne Drilling, MD;  Location: [facility];  Service: Orthopedics;  Laterality: Left;   TOTAL HIP ARTHROPLASTY Right 09-11-2000  dr Priscille Kluver  @[facility]    TOTAL HIP REVISION Right 12/06/2017   Procedure: Right hip polyethylene;  Surgeon: Ollen Gross, MD;  Location: [facility];  Service: Orthopedics;  Laterality: Right;   Social History   Occupational History   Not on file  Tobacco Use   Smoking status: Former    Packs/day: 1.00    Years: 25.00    Additional pack years: 0.00    Total pack years: 25.00    Types: Cigarettes    Quit date: 11/29/2008    Years since quitting: 13.7   Smokeless tobacco: Never  Vaping Use   Vaping Use: Never used  Substance and Sexual Activity   Alcohol use: Yes    Alcohol/week: 7.0 standard drinks of alcohol    Types: 7 Glasses of wine per week    Comment: 1  wine daily   Drug use: Never   Sexual activity: Not on file
